# Patient Record
Sex: Female | Born: 1955 | Race: White | Hispanic: No | Smoking: Never smoker
Health system: Southern US, Community
[De-identification: ages and names within clinical notes are randomized; demographics above are authoritative.]

## PROBLEM LIST (undated history)

## (undated) DIAGNOSIS — I10 Essential (primary) hypertension: Secondary | ICD-10-CM

## (undated) DIAGNOSIS — E785 Hyperlipidemia, unspecified: Secondary | ICD-10-CM

## (undated) DIAGNOSIS — E039 Hypothyroidism, unspecified: Secondary | ICD-10-CM

## (undated) DIAGNOSIS — K449 Diaphragmatic hernia without obstruction or gangrene: Secondary | ICD-10-CM

## (undated) DIAGNOSIS — T7840XA Allergy, unspecified, initial encounter: Secondary | ICD-10-CM

## (undated) DIAGNOSIS — K219 Gastro-esophageal reflux disease without esophagitis: Secondary | ICD-10-CM

## (undated) DIAGNOSIS — K222 Esophageal obstruction: Secondary | ICD-10-CM

## (undated) HISTORY — PX: BACK SURGERY: SHX140

## (undated) HISTORY — DX: Gastro-esophageal reflux disease without esophagitis: K21.9

## (undated) HISTORY — DX: Allergy, unspecified, initial encounter: T78.40XA

## (undated) HISTORY — DX: Hypothyroidism, unspecified: E03.9

## (undated) HISTORY — DX: Essential (primary) hypertension: I10

## (undated) HISTORY — DX: Hyperlipidemia, unspecified: E78.5

## (undated) HISTORY — DX: Esophageal obstruction: K22.2

## (undated) HISTORY — DX: Diaphragmatic hernia without obstruction or gangrene: K44.9

---
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
## 1999-03-06 ENCOUNTER — Ambulatory Visit (HOSPITAL_COMMUNITY): Admission: RE | Admit: 1999-03-06 | Discharge: 1999-03-06 | Payer: Self-pay | Admitting: Gastroenterology

## 1999-10-24 ENCOUNTER — Other Ambulatory Visit: Admission: RE | Admit: 1999-10-24 | Discharge: 1999-10-24 | Payer: Self-pay | Admitting: Obstetrics & Gynecology

## 2001-09-26 ENCOUNTER — Other Ambulatory Visit: Admission: RE | Admit: 2001-09-26 | Discharge: 2001-09-26 | Payer: Self-pay | Admitting: Obstetrics and Gynecology

## 2002-12-17 HISTORY — PX: ESOPHAGOGASTRODUODENOSCOPY (EGD) WITH ESOPHAGEAL DILATION: SHX5812

## 2003-01-28 ENCOUNTER — Ambulatory Visit (HOSPITAL_COMMUNITY): Admission: RE | Admit: 2003-01-28 | Discharge: 2003-01-28 | Payer: Self-pay | Admitting: Internal Medicine

## 2003-01-28 ENCOUNTER — Encounter: Payer: Self-pay | Admitting: Internal Medicine

## 2003-10-21 ENCOUNTER — Other Ambulatory Visit: Admission: RE | Admit: 2003-10-21 | Discharge: 2003-10-21 | Payer: Self-pay | Admitting: Obstetrics and Gynecology

## 2004-08-03 ENCOUNTER — Ambulatory Visit (HOSPITAL_COMMUNITY): Admission: RE | Admit: 2004-08-03 | Discharge: 2004-08-03 | Payer: Self-pay | Admitting: Chiropractic Medicine

## 2009-02-11 ENCOUNTER — Ambulatory Visit (HOSPITAL_COMMUNITY): Admission: RE | Admit: 2009-02-11 | Discharge: 2009-02-11 | Payer: Self-pay | Admitting: Internal Medicine

## 2009-03-11 ENCOUNTER — Ambulatory Visit (HOSPITAL_COMMUNITY): Admission: RE | Admit: 2009-03-11 | Discharge: 2009-03-11 | Payer: Self-pay | Admitting: Family Medicine

## 2009-03-31 ENCOUNTER — Ambulatory Visit (HOSPITAL_COMMUNITY): Admission: RE | Admit: 2009-03-31 | Discharge: 2009-04-01 | Payer: Self-pay | Admitting: Neurosurgery

## 2009-03-31 IMAGING — CR DG CHEST 2V
2 series · 2 of 2 positions shown · non-contrast
Comparison: None

CLINICAL DATA: Lumbar HNP and radiculopathy.

CHEST - 2 VIEW

[view not recorded (1 of 2)]
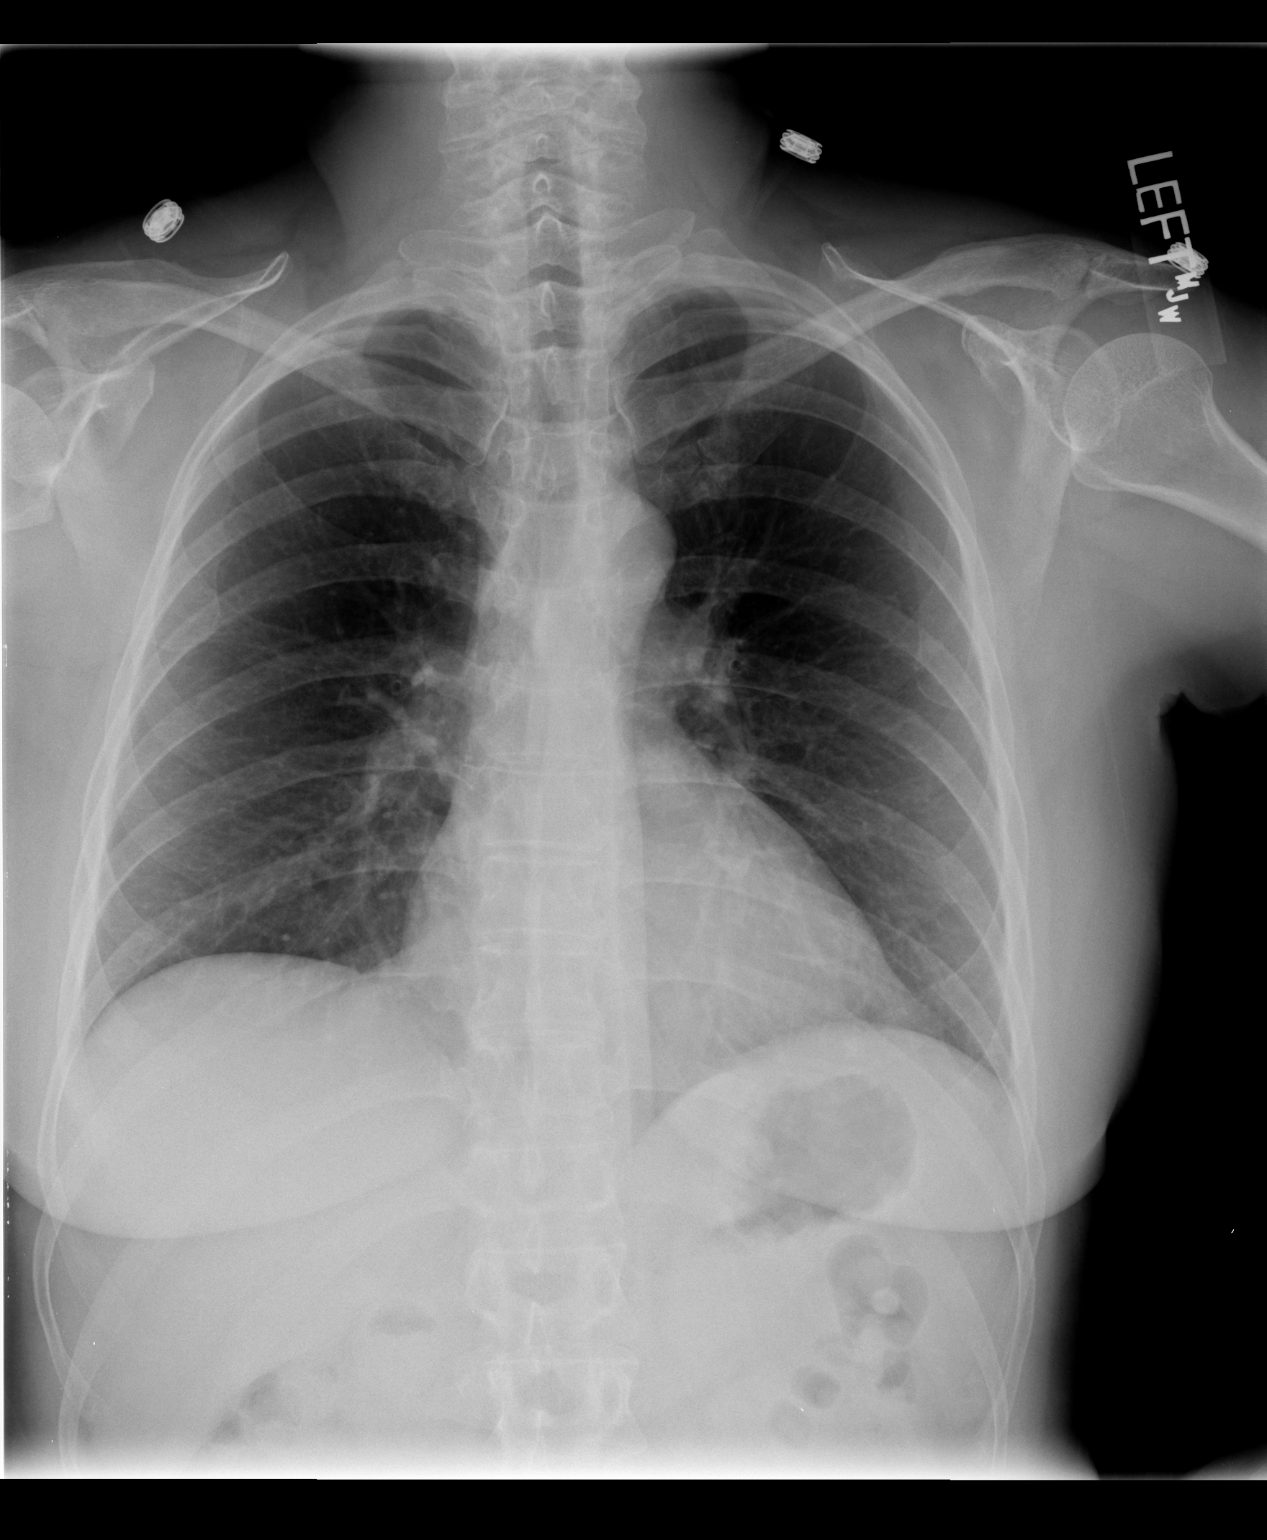

[view not recorded (2 of 2)]
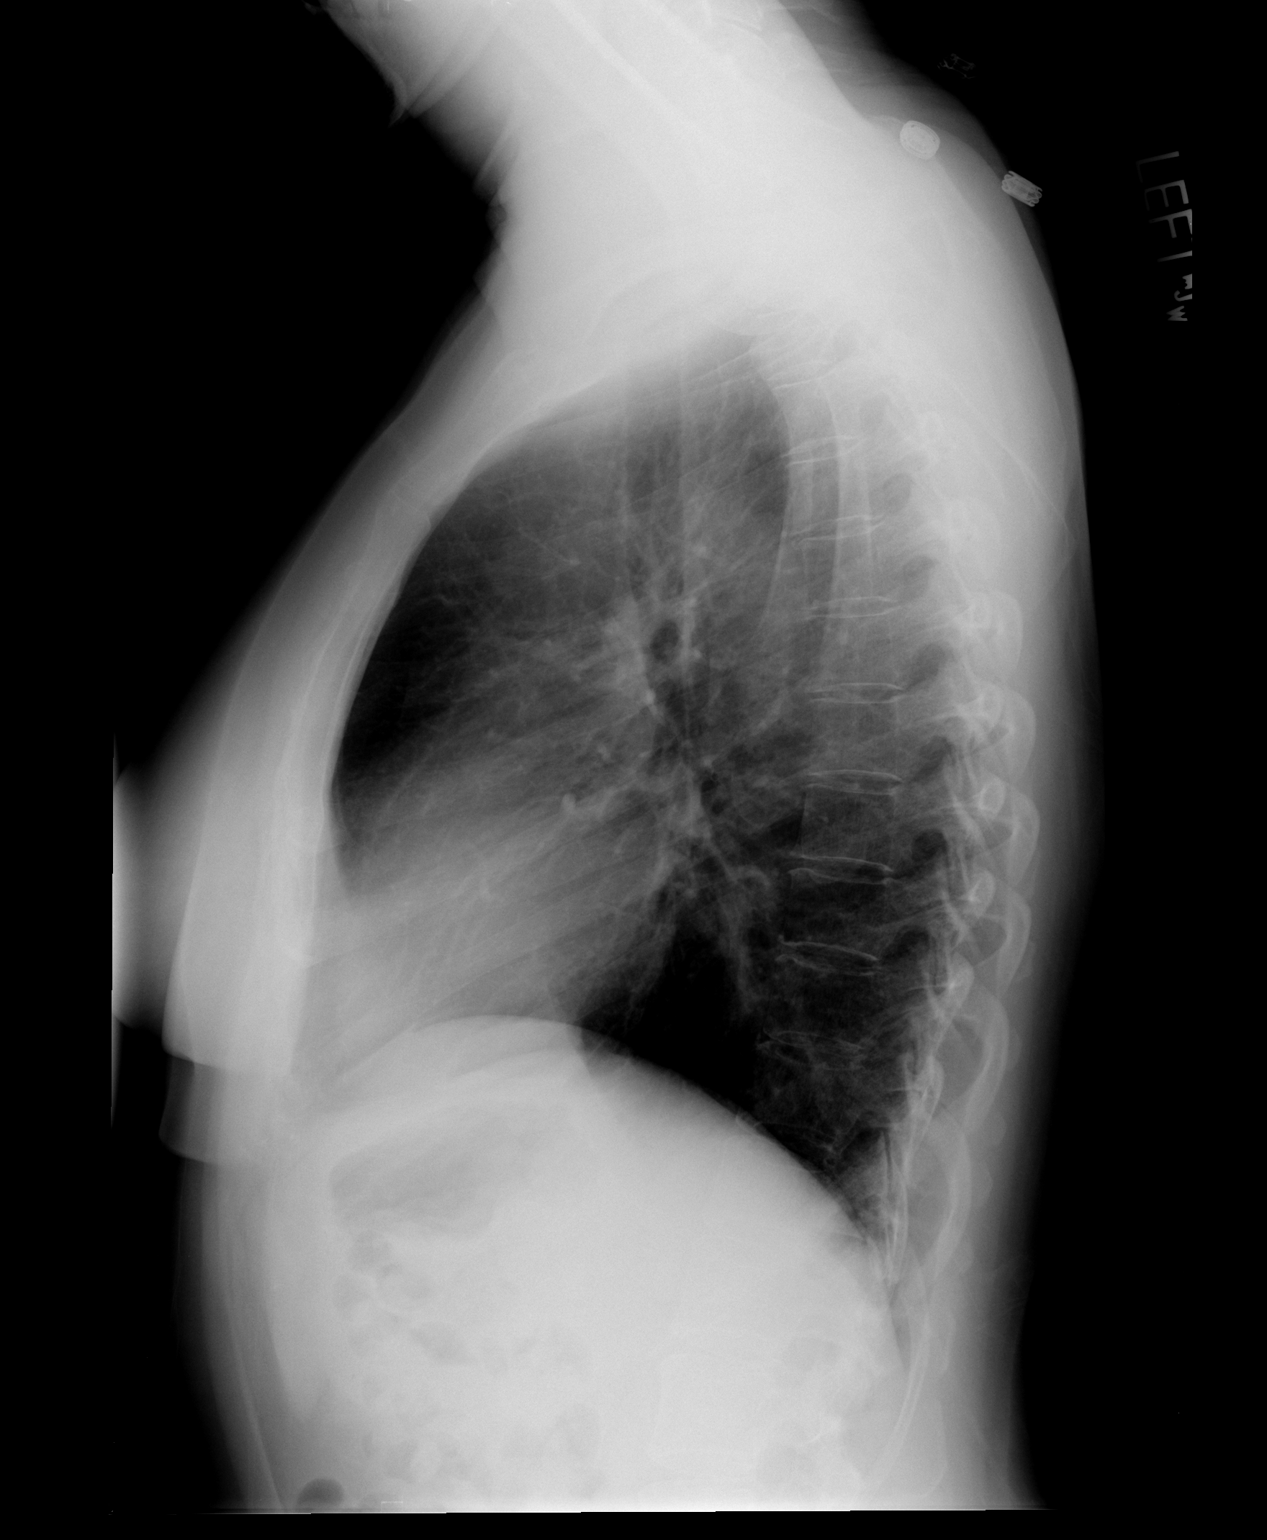

[2 of 2 positions shown; findings below may reference images not displayed]

FINDINGS: The heart size and mediastinal contours are within normal
limits.  Both lungs are clear.  The visualized skeletal structures
are unremarkable.
IMPRESSION: No active cardiopulmonary disease.

## 2011-03-28 LAB — URINALYSIS, ROUTINE W REFLEX MICROSCOPIC
Bilirubin Urine: NEGATIVE
Hgb urine dipstick: NEGATIVE
Ketones, ur: NEGATIVE mg/dL
Nitrite: NEGATIVE
Urobilinogen, UA: 0.2 mg/dL (ref 0.0–1.0)
pH: 8 (ref 5.0–8.0)

## 2011-03-28 LAB — APTT: aPTT: 27 seconds (ref 24–37)

## 2011-03-28 LAB — CBC
Platelets: 278 10*3/uL (ref 150–400)
RDW: 12.6 % (ref 11.5–15.5)

## 2011-03-28 LAB — BASIC METABOLIC PANEL
BUN: 11 mg/dL (ref 6–23)
Calcium: 9.3 mg/dL (ref 8.4–10.5)
Creatinine, Ser: 0.55 mg/dL (ref 0.4–1.2)
GFR calc non Af Amer: >60 mL/min (ref >60)
Glucose, Bld: 118 mg/dL — ABNORMAL HIGH (ref 70–99)

## 2011-03-28 LAB — PROTIME-INR: INR: 0.9 (ref 0.00–1.49)

## 2011-05-01 NOTE — Op Note (Signed)
[name], Sheila [name] NO.:  [id number]   MEDICAL RECORD NO.:  [id number]          PATIENT TYPE:  OIB   LOCATION:  3536                         FACILITY:  [facility]   PHYSICIAN:  Clydene Fake, M.D.  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   PROCEDURE:  Right L4-5 semi hemilaminectomy and diskectomy,  microdissection with microscope.   SURGEON:  Clydene Fake, MD   ASSISTANT:  Coletta Memos, MD   ANESTHESIA:  General endotracheal tube anesthesia.   ESTIMATED BLOOD LOSS:  Minimal.   BLOOD GIVEN:  None.   DRAINS:  None.   COMPLICATIONS:  None.   REASON FOR PROCEDURE:  The patient is a 55 year old woman with right  side back and leg pain with foot drop.  We have been recommending  surgery, but she wanted to try other nonsurgical options, steroid  Dosepaks, time, NSAIDs.  We talked about epidural steroid injections,  had that set up, but she desired not to do that and called [place] to change  her mind about surgery, and we brought her in for diskectomy.   PROCEDURE IN DETAIL:  The patient was brought to the operating room.  General anesthesia was induced.  The patient was placed on a Wilson  frame in a prone position with all pressure points padded.  The patient  was prepped and draped in sterile fashion.  Site of incision injected  with 10 mL of 1% lidocaine with epinephrine.  Needle was placed in the  interspace.  X-ray was obtained showing the needle pointing at the L5-S1  space.  Incision was then made centered the next level higher.  Incision  was taken to the fascia.  Hemostasis was obtained with Bovie  cauterization.  The right side of subperiosteal dissection was done  over  the spinous process lamina out to the facet.  A self-retaining retractor  was placed.  Markers were placed at the L4-5 interspace.  Another x-ray  was obtained confirming our positioning at L4-5.  At this point, a semi-  hemilaminectomy and facetectomy was performed with high-speed drill,  Kerrison punches.  Microscope was brought in for microdissection.  The  laminotomy was continued, removed hypertrophic ligament both posteriorly  and laterally and removed the top of the L5 lamina and did a  foraminotomy of the L5 root.  There was a very thick ligament that was a  part of the problem with the spondylosis, and was removed that had some  decompression of the thecal sac and the nerve root.  We then explored  the epidural space and disk space and found a disk herniation going  inferiorly right under the L5 root.  We were able to remove this as a  free fragment and the subligamentous fragment and this decompressed the  L5 root.  There was a small tear within the annulus.  We  were able to  push on the annulus and had some more disk extruded out and we removed  out, but after we continued pushing on the annulus and disk space, no  more disk was coming out through the small annular tear.  We explored  that, it was fairly small, so we did not open the disk space.  We used  bipolar to shrink up the annular fibers there and get hemostasis.  We  irrigated with antibiotic solution.  We had good hemostasis.  We had  good decompression of the thecal sac and the L5 root and no further disk  herniation was seen or appreciated.  At this point, the retractor was  removed.  Fascia was closed with 0 Vicryl interrupted sutures.  Subcutaneous tissue was closed with 0 Vicryl, 2-0 Vicryl, and 3-0 Vicryl  interrupted suture.  Skin was closed with benzoin and Steri-Strips.  Dressing was placed.  The patient was placed back in supine position,  awaken from anesthesia, and transferred to recovery room in  stable  condition.           ______________________________  Clydene Fake, M.D.     JRH/MEDQ  D:  03/31/2009  T:  04/01/2009  Job:  [id number]

## 2013-01-20 ENCOUNTER — Encounter: Payer: Self-pay | Admitting: Obstetrics and Gynecology

## 2013-01-20 ENCOUNTER — Ambulatory Visit: Payer: 59 | Admitting: Obstetrics and Gynecology

## 2013-01-20 VITALS — BP 134/76 | Ht 62.0 in | Wt 123.0 lb

## 2013-01-20 DIAGNOSIS — Z01419 Encounter for gynecological examination (general) (routine) without abnormal findings: Secondary | ICD-10-CM

## 2013-01-20 DIAGNOSIS — Z124 Encounter for screening for malignant neoplasm of cervix: Secondary | ICD-10-CM

## 2013-01-20 NOTE — Progress Notes (Signed)
Subjective:    Sheila [name] is a 57 y.o. female G1P0001 who presents for annual exam. The patient complaints of cramps in her legs. The patient has a history of hypothyroidism, hyperlipidemia, and urinary incontinence  The following portions of the patient's history were reviewed and updated as appropriate: allergies, current medications, past family history, past medical history, past social history, past surgical history and problem list.  Review of Systems Pertinent items are noted in HPI. Gastrointestinal:No change in bowel habits, no abdominal pain, no rectal bleeding Genitourinary:negative for dysuria, frequency, hematuria    Objective:     BP 134/76  Ht 5\' 2"  (1.575 m)  Wt 123 lb (55.792 kg)  BMI 22.50 kg/m2  LMP 02/13/2005  Weight:  Wt Readings from Last 1 Encounters:  01/20/13 123 lb (55.792 kg)     BMI: Body mass index is 22.50 kg/(m^2). General Appearance: Alert, appropriate appearance for age. No acute distress HEENT: Grossly normal Neck / Thyroid: Supple, no masses, nodes or enlargement Lungs: clear to auscultation bilaterally Back: No CVA tenderness Breast Exam: No masses or nodes.No dimpling, nipple retraction or discharge. Cardiovascular: Regular rate and rhythm. S1, S2, no murmur Gastrointestinal: Soft, non-tender, no masses or organomegaly  ++++++++++++++++++++++++++++++++++++++++++++++++++++++++  Pelvic Exam: External genitalia: normal general appearance Vaginal: atrophic mucosa Cervix: normal appearance Adnexa: normal bimanual exam Uterus: normal size shape and consistency Rectovaginal: normal rectal, no masses  ++++++++++++++++++++++++++++++++++++++++++++++++++++++++  Lymphatic Exam: Non-palpable nodes in neck, clavicular, axillary, or inguinal regions  Psychiatric: Alert and oriented, appropriate affect.      Assessment:    Normal gyn exam   Overweight or obese: No  Pelvic relaxation: Yes  Menopausal symptoms: No. Severe:  No.  Hyperlipidemia  Hypothyroidism  Urinary incontinence  Leg cramps   Plan:    Mammogram. Pap smear.   Follow-up:  for annual exam  See her primary physician for leg cramps and general medical care  The updated Pap smear screening guidelines were discussed with the patient. The patient requested that I obtain a Pap smear: Yes.  Kegel exercises discussed: Yes.  Proper diet and regular exercise were reviewed.  Annual mammograms recommended starting at age 41. Proper breast care was discussed.  Screening colonoscopy is recommended beginning at age 41.  Regular health maintenance was reviewed.  Sleep hygiene was discussed.  Adequate calcium and vitamin D intake was emphasized.  Leonard Schwartz M.D.   Regular Periods: no  Mammogram: Yes  Monthly Breast Ex.: yes Exercise: no  Tetanus < 10 years: no Seatbelts: yes  NI. Bladder Functn.: yes Abuse at home: no  Daily BM's: yes Stressful Work: no  Healthy Diet: no Sigmoid-Colonoscopy: per pt 1998  Calcium: no Medical problems this year: none   LAST PAP:12/29/08  Contraception: none  Mammogram:  09/05/2011  PCP: Dr Antony Haste  PMH: none  FMH: none  Last Bone Scan: 03/14/2009

## 2013-01-22 LAB — PAP IG W/ RFLX HPV ASCU

## 2014-10-18 ENCOUNTER — Encounter: Payer: Self-pay | Admitting: Obstetrics and Gynecology

## 2015-06-09 ENCOUNTER — Other Ambulatory Visit: Payer: Self-pay

## 2017-02-08 ENCOUNTER — Encounter: Payer: Self-pay | Admitting: Family Medicine

## 2017-02-08 ENCOUNTER — Encounter (HOSPITAL_COMMUNITY): Payer: Self-pay | Admitting: *Deleted

## 2017-02-08 ENCOUNTER — Encounter (HOSPITAL_COMMUNITY)
Admission: EM | Disposition: A | Discharge status: Home / Self Care | Payer: Self-pay | Source: Home / Self Care | Attending: Emergency Medicine

## 2017-02-08 ENCOUNTER — Ambulatory Visit (INDEPENDENT_AMBULATORY_CARE_PROVIDER_SITE_OTHER): Payer: 59 | Admitting: Family Medicine

## 2017-02-08 ENCOUNTER — Ambulatory Visit (HOSPITAL_COMMUNITY)
Admission: EM | Admit: 2017-02-08 | Discharge: 2017-02-08 | Disposition: A | Discharge status: Home / Self Care | Payer: 59 | Attending: Emergency Medicine | Admitting: Emergency Medicine

## 2017-02-08 ENCOUNTER — Encounter: Payer: Self-pay | Admitting: Physician Assistant

## 2017-02-08 VITALS — BP 167/92 | HR 69 | Temp 98.1°F | Resp 20 | Ht 62.0 in | Wt 119.8 lb

## 2017-02-08 DIAGNOSIS — T18128A Food in esophagus causing other injury, initial encounter: Secondary | ICD-10-CM | POA: Diagnosis present

## 2017-02-08 DIAGNOSIS — K222 Esophageal obstruction: Secondary | ICD-10-CM

## 2017-02-08 DIAGNOSIS — K219 Gastro-esophageal reflux disease without esophagitis: Secondary | ICD-10-CM | POA: Diagnosis not present

## 2017-02-08 DIAGNOSIS — Z7689 Persons encountering health services in other specified circumstances: Secondary | ICD-10-CM

## 2017-02-08 DIAGNOSIS — E785 Hyperlipidemia, unspecified: Secondary | ICD-10-CM | POA: Diagnosis not present

## 2017-02-08 DIAGNOSIS — Z803 Family history of malignant neoplasm of breast: Secondary | ICD-10-CM | POA: Insufficient documentation

## 2017-02-08 DIAGNOSIS — K2210 Ulcer of esophagus without bleeding: Secondary | ICD-10-CM | POA: Diagnosis not present

## 2017-02-08 DIAGNOSIS — R1310 Dysphagia, unspecified: Secondary | ICD-10-CM | POA: Diagnosis not present

## 2017-02-08 DIAGNOSIS — X58XXXA Exposure to other specified factors, initial encounter: Secondary | ICD-10-CM | POA: Insufficient documentation

## 2017-02-08 DIAGNOSIS — Z79899 Other long term (current) drug therapy: Secondary | ICD-10-CM | POA: Insufficient documentation

## 2017-02-08 DIAGNOSIS — Z886 Allergy status to analgesic agent status: Secondary | ICD-10-CM | POA: Insufficient documentation

## 2017-02-08 DIAGNOSIS — Z833 Family history of diabetes mellitus: Secondary | ICD-10-CM | POA: Diagnosis not present

## 2017-02-08 DIAGNOSIS — E079 Disorder of thyroid, unspecified: Secondary | ICD-10-CM | POA: Diagnosis not present

## 2017-02-08 DIAGNOSIS — K449 Diaphragmatic hernia without obstruction or gangrene: Secondary | ICD-10-CM | POA: Insufficient documentation

## 2017-02-08 DIAGNOSIS — Z8349 Family history of other endocrine, nutritional and metabolic diseases: Secondary | ICD-10-CM | POA: Insufficient documentation

## 2017-02-08 DIAGNOSIS — Z800 Family history of malignant neoplasm of digestive organs: Secondary | ICD-10-CM | POA: Diagnosis not present

## 2017-02-08 HISTORY — PX: ESOPHAGOGASTRODUODENOSCOPY: SHX5428

## 2017-02-08 SURGERY — EGD (ESOPHAGOGASTRODUODENOSCOPY)
Anesthesia: Moderate Sedation

## 2017-02-08 MED ORDER — DIPHENHYDRAMINE HCL 50 MG/ML IJ SOLN
INTRAMUSCULAR | Status: DC | PRN
  Administered 2017-02-08: 25 mg via INTRAVENOUS

## 2017-02-08 MED ORDER — FENTANYL CITRATE (PF) 100 MCG/2ML IJ SOLN
INTRAMUSCULAR | Status: AC
  Filled 2017-02-08: qty 2

## 2017-02-08 MED ORDER — MIDAZOLAM HCL 5 MG/ML IJ SOLN
INTRAMUSCULAR | Status: AC
  Filled 2017-02-08: qty 2

## 2017-02-08 MED ORDER — MIDAZOLAM HCL 10 MG/2ML IJ SOLN
INTRAMUSCULAR | Status: DC | PRN
  Administered 2017-02-08: 2 mg via INTRAVENOUS
  Administered 2017-02-08: 1 mg via INTRAVENOUS

## 2017-02-08 MED ORDER — DIPHENHYDRAMINE HCL 50 MG/ML IJ SOLN
INTRAMUSCULAR | Status: AC
  Filled 2017-02-08: qty 1

## 2017-02-08 MED ORDER — FENTANYL CITRATE (PF) 100 MCG/2ML IJ SOLN
INTRAMUSCULAR | Status: DC | PRN
  Administered 2017-02-08: 25 ug via INTRAVENOUS

## 2017-02-08 NOTE — ED Notes (Signed)
Bed: RESA Expected date:  Expected time:  Means of arrival:  Comments: Hold for endo

## 2017-02-08 NOTE — ED Notes (Signed)
Endo report  Pt had 3 of versed  25 fentanyl  25 benadryl  ----------- monitor patient until awake and 30 to 45 mins after. Per verbal by provider  ------------ Pt to be on a all liquid diet for next 24hrs  ------------ Pt needs to be on Carafate 10cc every 6 hrs for next few days and then PRN as Needed.  -------

## 2017-02-08 NOTE — ED Triage Notes (Addendum)
Pt sent from [place] center for food bolus and vomiting/unable to keep anything down. Dr. Ruthell Rummage is the accepting doctor.   Pt received 1000cc NS, ativan, zofran, glucagon, GI cocktail with no relief.

## 2017-02-08 NOTE — ED Triage Notes (Signed)
Pt is alert x4 , pt consent form in room, pt fluids attached, pt placed on cardiac monitor and pt has IV access. Pt has gastro nurse in room.

## 2017-02-08 NOTE — H&P (View-Only) (Signed)
   HPI :  61 y/o female with a history of esophageal stricture s/p dilation in 2004, GERD, hiatal hernia, presenting with a food impaction to the ER this evening. She reports eating chicken and green beans last night, felt something get stuck and tried to vomit it up without success. She has had some mild low sternal discomfort since this time but has not been able to tolerate any food or liquids for the past 24 hours. She endorses periodic dysphagia but not severe. She's been on protonix which has controlled her reflux symptoms. She was seen in [place] ER today, they offered her transfer to [facility] but she declined, preferring to come here as she is known to Dr. Henrene Pastor. Trial of glucagon did not provide any benefit. She has never had a food impaction in the past. No chest pains or shortness of breath otherwise. No problems with anesthesia in the past.   Past Medical History:  Diagnosis Date  . Allergy   . Esophageal stricture   . GERD (gastroesophageal reflux disease)   . Hiatal hernia   . Hyperlipidemia   . Thyroid disease      Past Surgical History:  Procedure Laterality Date  . APPENDECTOMY    . BACK SURGERY    . ESOPHAGOGASTRODUODENOSCOPY (EGD) WITH ESOPHAGEAL DILATION  2004   Family History  Problem Relation Age of Onset  . Diabetes Mother   . Breast cancer Mother   . Hyperlipidemia Mother   . Cancer Father     colon/brain  . Early death Father    Social History  Substance Use Topics  . Smoking status: Never Smoker  . Smokeless tobacco: Never Used  . Alcohol use No   No current facility-administered medications for this encounter.    Current Outpatient Prescriptions  Medication Sig Dispense Refill  . levothyroxine (SYNTHROID, LEVOTHROID) 75 MCG tablet TAKE ONE TABLET BY MOUTH EVERY DAY    . pantoprazole (PROTONIX) 40 MG tablet Take 40 mg by mouth daily.     Allergies  Allergen Reactions  . Nsaids      Review of Systems: All systems reviewed  and negative except where noted in HPI.     Physical Exam: vitals per ED charting Constitutional: Pleasant,well-developed,female in no acute distress. HEENT: Normocephalic and atraumatic. Conjunctivae are normal. No scleral icterus. Neck supple.  Cardiovascular: Normal rate, regular rhythm.  Pulmonary/chest: Effort normal and breath sounds normal.  Abdominal: Soft, nondistended, nontender. There are no masses palpable. No hepatomegaly. Extremities: no edema Lymphadenopathy: No cervical adenopathy noted. Neurological: Alert and oriented to person place and time. Skin: Skin is warm and dry. No rashes noted. Psychiatric: Normal mood and affect. Behavior is normal.   ASSESSMENT AND PLAN: 61 y/o female with history of distal esophageal stricture, presenting with a food impaction which occurred > 48 hours. Intolerant of liquids. I discussed the need for emergent endoscopy to relieve food bolus. I discussed the risks and benefits of endoscopy and sedation with her and she wished to proceed. Depending on findings, assuming this impaction is caused by known peptic stricture, may dilate if amenable on exam. She agreed and verbalized understanding.   [place] Cellar, MD [facility] Gastroenterology Pager [phone]

## 2017-02-08 NOTE — Progress Notes (Signed)
Patient ID: Sheila [name], female  DOB: 15-Dec-1956, 61 y.o.   MRN: [id number] Patient Care Team    Relationship Specialty Notifications Start End  Ma Hillock, DO PCP - General Family Medicine  02/08/17   Ena Dawley, MD Consulting Physician Obstetrics and Gynecology  02/11/17   Irene Shipper, MD Consulting Physician Gastroenterology  02/11/17     Subjective:  Sheila [name] is a 61 y.o.  female present for new patient establishment. All past medical history, surgical history, allergies, family history, immunizations, medications and social history were Obtained/entered in the electronic medical record today. All recent labs, ED visits and hospitalizations within the last year were reviewed.  Dysphagia: Patient presents for new patient establishment today with reports of difficulty swallowing over the last couple days. She states she's had an esophageal dilation completed in 2004 by Dr. Henrene Pastor. She has a history of reflux and is prescribed protonic 40 mg daily. She reports she is able to swallow, but food and liquid is getting caught. She feels as if something is caught in her throat currently.   There is no immunization history on file for this patient.   Past Medical History:  Diagnosis Date  . Allergy   . Esophageal stricture   . GERD (gastroesophageal reflux disease)   . Hiatal hernia   . Hyperlipidemia   . Hypothyroidism    Allergies  Allergen Reactions  . Nsaids    Past Surgical History:  Procedure Laterality Date  . APPENDECTOMY  1967  . BACK SURGERY    . ESOPHAGOGASTRODUODENOSCOPY N/A 02/08/2017   Procedure: ESOPHAGOGASTRODUODENOSCOPY (EGD);  Surgeon: Manus Gunning, MD;  Location: [facility];  Service: Gastroenterology;  Laterality: N/A;  . ESOPHAGOGASTRODUODENOSCOPY (EGD) WITH ESOPHAGEAL DILATION  2004   Family History  Problem Relation Age of Onset  . Diabetes Mother   . Breast cancer Mother   . Hyperlipidemia Mother   . Cancer Father       colon/brain  . Early death Father    Social History   Social History  . Marital status: Divorced    Spouse name: N/A  . Number of children: N/A  . Years of education: 76   Occupational History  . [employer]    Social History Main Topics  . Smoking status: Never Smoker  . Smokeless tobacco: Never Used  . Alcohol use No  . Drug use: No  . Sexual activity: Yes    Partners: Male    Birth control/ protection: None   Other Topics Concern  . Not on file   Social History Narrative   Single. 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   Drinks caffeine.   Wears a seatbelt, smoke detector in the home.   Feels safe in her relationships.   Allergies as of 02/08/2017      Reactions   Nsaids       Medication List       Accurate as of 02/08/17  8:40 PM. Always use your most recent med list.          levothyroxine 75 MCG tablet Commonly known as:  SYNTHROID, LEVOTHROID TAKE 75 MCG BY MOUTH EVERY MORNING   pantoprazole 40 MG tablet Commonly known as:  PROTONIX Take 40 mg by mouth daily.        No results found for this or any previous visit (from the past 2160 hour(s)).  Dg Chest 2 View  Result Date: 03/31/2009 Clinical Data: Lumbar HNP and radiculopathy.  CHEST -  2 VIEW  Comparison: None  Findings: The heart size and mediastinal contours are within normal limits.  Both lungs are clear.  The visualized skeletal structures are unremarkable.  IMPRESSION: No active cardiopulmonary disease. Provider: Anne Ng  Dg Lumbar Spine 2-3 Views  Result Date: 03/31/2009 Clinical Data: L4-L5 discectomy.  LUMBAR SPINE - 2-3 VIEW  Comparison: None  Findings: 2 intraoperative images.  The first film, labeled 1635 hours, demonstrates a surgical device projecting posterior to the L5-S1 level.  This presumes a diminutive S1-S2 intervertebral disc.  The second film, labeled 1647 hours, demonstrates a surgical device projecting posterior to the L4-L5 level.  IMPRESSION:  1.   Intraoperative localization of L4-L5. 2.  Please note that this nomenclature presumes a diminutive S1-S2 disc.  If there is prior imaging for comparison, this should be correlated. Provider: Hampton Abbot, Starleen Arms    ROS: 14 pt review of systems performed and negative (unless mentioned in an HPI)  Objective: BP (!) 167/92 (BP Location: Left Arm, Patient Position: Sitting, Cuff Size: Normal)   Pulse 69   Temp 98.1 F (36.7 C)   Resp 20   Ht 5\' 2"  (1.575 m)   Wt 119 lb 12 oz (54.3 kg)   LMP 02/13/2005   SpO2 99%   BMI 21.90 kg/m  Gen: Afebrile. No acute distress. Nontoxic in appearance, well-developed, well-nourished, pleasant Caucasian female. HENT: AT. .  Mildly tacky mucous membranes, no oral lesions. Throat without erythema, ulcerations or exudates. No Cough on exam, no hoarseness on exam. Able to swallow without difficulty. Eyes:Pupils Equal Round Reactive to light, Extraocular movements intact,  Conjunctiva without redness, discharge or icterus. Neck/lymp/endocrine: Supple, no lymphadenopathy, no thyromegaly CV: RRR, no edema, +2/4 P posterior tibialis pulses.  Chest: CTAB, no wheeze, rhonchi or crackles.  Abd: Soft.NTND. BS present  Skin:  Warm and well-perfused. Skin intact. Neuro/Msk:Normal gait. PERLA. EOMi. Alert. Oriented x3.   Psych: Mildly anxious, otherwise Normal affect, dress and demeanor. Normal speech. Normal thought content and judgment.   Assessment/plan: Sheila [name] is a 61 y.o. female present for establishment of care with dysphagia. Dysphagia, unspecified type/Gastroesophageal reflux disease, esophagitis presence not specified - Discussed with patient that her symptoms sound as if she has if she either has food actively caught in her esophagus, or she has enough swelling/stenosis of her esophagus that the sensation of something being stuck is present. Either way this is cause for a urgent referral versus emergency room visit to address.  Discussed with patient if she is absolutely unable to eat or drink anything, she is to be seen in the emergency room over the weekend. She currently looks mildly dehydrated already. We have contacted Dr. Blanch Media office to schedule her urgent appointment, the earliest they could get her in was early March.  As far as establishment, patient will need follow-up for physical after March 27, and labs will be collected at that time.   No Follow-up on file.  Electronically signed by: Howard Pouch, DO [place]

## 2017-02-08 NOTE — Interval H&P Note (Signed)
History and Physical Interval Note:  02/08/2017 9:08 PM  Sheila [name]  has presented today for surgery, with the diagnosis of food impaction  The various methods of treatment have been discussed with the patient and family. After consideration of risks, benefits and other options for treatment, the patient has consented to  Procedure(s): ESOPHAGOGASTRODUODENOSCOPY (EGD) (N/A) as a surgical intervention .  The patient's history has been reviewed, patient examined, no change in status, stable for surgery.  I have reviewed the patient's chart and labs.  Questions were answered to the patient's satisfaction.     [name]

## 2017-02-08 NOTE — Discharge Instructions (Signed)
Pt to be on a all liquid diet for next 24hrs  ------------ Pt needs to be on Carafate 10cc every 6 hrs for next few days and then PRN as Needed.

## 2017-02-08 NOTE — Op Note (Addendum)
[facility] Patient Name: Ishi Ledden Procedure Date: 02/08/2017 MRN: [id number] Attending MD: Carlota Raspberry. Armbruster MD, MD Date of Birth: Dec 08, 1956 CSN: [id number] Age: 61 Admit Type: Emergency Department Procedure:                Upper GI endoscopy Indications:              Foreign body in the esophagus, history of dysphagia                            and esophageal stricture Providers:                Remo Lipps P. Armbruster MD, MD, Vista Lawman, RN,                            Corliss Parish, Technician Referring MD:              Medicines:                Fentanyl 25 micrograms IV, Midazolam 3 mg IV,                            Diphenhydramine 25 mg IV Complications:            No immediate complications. Estimated blood loss:                            None. Estimated Blood Loss:     Estimated blood loss: none. Procedure:                Pre-Anesthesia Assessment:                           - Prior to the procedure, a History and Physical                            was performed, and patient medications and                            allergies were reviewed. The patient's tolerance of                            previous anesthesia was also reviewed. The risks                            and benefits of the procedure and the sedation                            options and risks were discussed with the patient.                            All questions were answered, and informed consent                            was obtained. Prior Anticoagulants: The patient has  taken no previous anticoagulant or antiplatelet                            agents. ASA Grade Assessment: II - A patient with                            mild systemic disease. After reviewing the risks                            and benefits, the patient was deemed in                            satisfactory condition to undergo the procedure.                           After obtaining  informed consent, the endoscope was                            passed under direct vision. Throughout the                            procedure, the patient's blood pressure, pulse, and                            oxygen saturations were monitored continuously. The                            [id number]) scope was introduced through the                            mouth, and advanced to the body of the stomach. The                            upper GI endoscopy was accomplished without                            difficulty. The patient tolerated the procedure                            well. Scope In: Scope Out: Findings:      Food bolus (chicken) was found at the gastroesophageal junction and       pushed into the stomach with mild resistance.      Esophagogastric landmarks were identified: the Z-line was found at 31       cm, the gastroesophageal junction was found at 31 cm and the upper       extent of the gastric folds was found at 38 cm from the incisors.      A 6 cm hiatal hernia was present.      One moderate benign-appearing, intrinsic stenosis was found. This       measured less than one cm and perhaps 12-67mm in diameter and was       traversed. There was an ulceration along the superior aspect of the       stricture at the site of impaction, roughly  30% of the diameter of the       stricture without any bleeding. No dilation was performed given this       finding.      The exam of the esophagus was otherwise normal.      Limited examination of the gastric body was normal. The patient did not       retain air well in her stomach and retroflexion was not performed given       residual food noted. Not much of the stomach was evaluated. Impression:               - Food was found in the esophagus, easily pushed                            into the stomach.                           - Esophagogastric landmarks identified.                           - 6 cm hiatal hernia.                            - Benign-appearing esophageal stenosis with                            ulceration at site of impaction. No bleeding                            appreciated.                           - Normal limited exam of proximal stomach. Moderate Sedation:      Moderate (conscious) sedation was administered by the endoscopy nurse       and supervised by the endoscopist. The following parameters were       monitored: oxygen saturation, heart rate, blood pressure, and response       to care. Total physician intraservice time was 9 minutes. Recommendation:           - Patient has a contact number available for                            emergencies. The signs and symptoms of potential                            delayed complications were discussed with the                            patient. Return to normal activities tomorrow.                            Written discharge instructions were provided to the                            patient.                           -  Clear liquid diet today, advance to soft tomorrow                            as tolerated. NO MEAT until repeat EGD is done                           - Continue present medications (protonix daily)                           - Add liquid Carafate 10cc PO every 6 hours for the                            next few days to help heal esophageal ulceration                           - Repeat upper endoscopy in 2-3 weeks as outpatient                            for dilation to prevent recurrent impaction Procedure Code(s):        --- Professional ---                           [id number], 75, Esophagogastroduodenoscopy, flexible,                            transoral; diagnostic, including collection of                            specimen(s) by brushing or washing, when performed                            (separate procedure) Diagnosis Code(s):        --- Professional ---                           [id number], Food in esophagus causing other  injury,                            initial encounter                           K44.9, Diaphragmatic hernia without obstruction or                            gangrene                           K22.2, Esophageal obstruction                           T18.108A, Unspecified foreign body in esophagus                            causing other injury, initial encounter CPT copyright 2016 American Medical Association. All rights reserved. The codes documented in this report are preliminary and upon  coder review may  be revised to meet current compliance requirements. Remo Lipps P. Armbruster MD, MD 02/08/2017 9:49:19 PM This report has been signed electronically. Number of Addenda: 0

## 2017-02-08 NOTE — Patient Instructions (Signed)
We have your appt scheduled for the date on your card with Dr. Henrene Pastor.   If you are unable to eat or drink without choking you will need to go to ED immediately.   Follow up after March 28 th for physical.   It was nice to meet you today.     Achalasia Introduction Achalasia is a disorder of the tube that connects your mouth and stomach (esophagus). This disorder makes it difficult for your esophagus to move liquids and food into your stomach. This makes it harder for you to swallow. Achalasia also affects the muscle between your stomach and your esophagus (lower esophageal sphincter, LES). Normally, this muscle relaxes after you swallow. With achalasia, it does not relax, and there is increased pressure in this area. What are the causes? The cause is not known. What increases the risk? Risk factors may include:  Age. Achalasia is more common in older adults.  Family history of achalasia. What are the signs or symptoms? Signs and symptoms of achalasia may include:  Difficulty swallowing or painful swallowing.  Weight loss.  Chest pain.  Coughing or wheezing.  Heartburn.  Burping.  Vomiting.  Bad breath (halitosis). How is this diagnosed? Achalasia may be diagnosed by:  X-ray.  Endoscopy. This is when your health care provider looks at your esophagus with a small flexible tube that has a video camera at the end.  Studies to see if there is increased pressure in your LES. How is this treated? There is no cure for achalasia. Treatment is directed at managing your symptoms. Treatment may include:  Medicines.  Stretching or dilating your LES.  Surgery to reduce pressure in your LES. Follow these instructions at home:  Take medicines only as directed by your health care provider.  Do not eat or drink while lying down.  Do not drink hot or cold liquids.  Eat your food slowly.  Keep all follow-up visits as directed by your health care provider. This is  important. Contact a health care provider if:  Your symptoms don't go away after treatment.  You have a fever.  You have any new symptoms.  Your pain is worse. Get help right away if:  You vomit blood.  Your have chest pain.  You have difficulty breathing. This information is not intended to replace advice given to you by your health care provider. Make sure you discuss any questions you have with your health care provider. Document Released: 09/12/2005 Document Revised: 05/10/2016 Document Reviewed: 05/11/2014  2017 Elsevier  Please help [place] help you:  We are honored you have chosen [facility] for your Primary Care home. Below you will find basic instructions that you may need to access in the future. Please help [place] help you by reading the instructions, which cover many of the frequent questions we experience.   Prescription refills and request:  -In order to allow more efficient response time, please call your pharmacy for all refills. They will forward the request electronically to [place]. This allows for the quickest possible response. Request left on a nurse line can take longer to refill, since these are checked as time allows between office patients and other phone calls.  - refill request can take up to 3-5 working days to complete.  - If request is sent electronically and request is appropiate, it is usually completed in 1-2 business days.  - all patients will need to be seen routinely for all chronic medical conditions requiring prescription medications (see follow-up  below). If you are overdue for follow up on your condition, you will be asked to make an appointment and we will call in enough medication to cover you until your appointment (up to 30 days).  - all controlled substances will require a face to face visit to request/refill.  - if you desire your prescriptions to go through a new pharmacy, and have an active script at original pharmacy, you will need to call  your pharmacy and have scripts transferred to new pharmacy. This is completed between the pharmacy locations and not by your provider.    Results: If any images or labs were ordered, it can take up to 1 week to get results depending on the test ordered and the lab/facility running and resulting the test. - Normal or stable results, which do not need further discussion, will be released to your mychart immediately with attached note to you. A call will not be generated for normal results. Please make certain to sign up for mychart. If you have questions on how to activate your mychart you can call the front office.  - If your results need further discussion, our office will attempt to contact you via phone, and if unable to reach you after 2 attempts, we will release your abnormal result to your mychart with instructions.  - All results will be automatically released in mychart after 1 week.  - Your provider will provide you with explanation and instruction on all relevant material in your results. Please keep in mind, results and labs may appear confusing or abnormal to the untrained eye, but it does not mean they are actually abnormal for you personally. If you have any questions about your results that are not covered, or you desire more detailed explanation than what was provided, you should make an appointment with your provider to do so.   Our office handles many outgoing and incoming calls daily. If we have not contacted you within 1 week about your results, please check your mychart to see if there is a message first and if not, then contact our office.  In helping with this matter, you help decrease call volume, and therefore allow [place] to be able to respond to patients needs more efficiently.   Acute office visits (sick visit):  An acute visit is intended for a new problem and are scheduled in shorter time slots to allow schedule openings for patients with new problems. This is the appropriate  visit to discuss a new problem. In order to provide you with excellent quality medical care with proper time for you to explain your problem, have an exam and receive treatment with instructions, these appointments should be limited to one new problem per visit. If you experience a new problem, in which you desire to be addressed, please make an acute office visit, we save openings on the schedule to accommodate you. Please do not save your new problem for any other type of visit, let us take care of it properly and quickly for you.   Follow up visits:  Depending on your condition(s) your provider will need to see you routinely in order to provide you with quality care and prescribe medication(s). Most chronic conditions (Example: hypertension, Diabetes, depression/anxiety... etc), require visits a couple times a year. Your provider will instruct you on proper follow up for your personal medical conditions and history. Please make certain to make follow up appointments for your condition as instructed. Failing to do so could result in lapse in  your medication treatment/refills. If you request a refill, and are overdue to be seen on a condition, we will always provide you with a 30 day script (once) to allow you time to schedule.    Medicare wellness (well visit): - we have a wonderful Nurse Maudie Mercury), that will meet with you and provide you will yearly medicare wellness visits. These visits should occur yearly (can not be scheduled less than 1 calendar year apart) and cover preventive health, immunizations, advance directives and screenings you are entitled to yearly through your medicare benefits. Do not miss out on your entitled benefits, this is when medicare will pay for these benefits to be ordered for you.  These are strongly encouraged by your provider and is the appropriate type of visit to make certain you are up to date with all preventive health benefits. If you have not had your medicare wellness exam  in the last 12 months, please make certain to schedule one by calling the office and schedule your medicare wellness with Maudie Mercury as soon as possible.   Yearly physical (well visit):  - Adults are recommended to be seen yearly for physicals. Check with your insurance and date of your last physical, most insurances require one calendar year between physicals. Physicals include all preventive health topics, screenings, medical exam and labs that are appropriate for gender/age and history. You may have fasting labs needed at this visit. This is a well visit (not a sick visit), acute topics should not be covered during this visit.  - Pediatric patients are seen more frequently when they are younger. Your provider will advise you on well child visit timing that is appropriate for your their age. - This is not a medicare wellness visit. Medicare wellness exams do not have an exam portion to the visit. Some medicare companies allow for a physical, some do not allow a yearly physical. If your medicare allows a yearly physical you can schedule the medicare wellness with our nurse Maudie Mercury and have your physical with your provider after, on the same day. Please check with insurance for your full benefits.   Late Policy/No Shows:  - all new patients should arrive 15-30 minutes earlier than appointment to allow [place] time  to  obtain all personal demographics,  insurance information and for you to complete office paperwork. - All established patients should arrive 10-15 minutes earlier than appointment time to update all information and be checked in .  - In our best efforts to run on time, if you are late for your appointment you will be asked to either reschedule or if able, we will work you back into the schedule. There will be a wait time to work you back in the schedule,  depending on availability.  - If you are unable to make it to your appointment as scheduled, please call 24 hours ahead of time to allow [place] to fill the  time slot with someone else who needs to be seen. If you do not cancel your appointment ahead of time, you may be charged a no show fee.

## 2017-02-08 NOTE — Consult Note (Signed)
   HPI :  61 y/o female with a history of esophageal stricture s/p dilation in 2004, GERD, hiatal hernia, presenting with a food impaction to the ER this evening. She reports eating chicken and green beans last night, felt something get stuck and tried to vomit it up without success. She has had some mild low sternal discomfort since this time but has not been able to tolerate any food or liquids for the past 24 hours. She endorses periodic dysphagia but not severe. She's been on protonix which has controlled her reflux symptoms. She was seen in [place] ER today, they offered her transfer to [facility] but she declined, preferring to come here as she is known to Dr. Henrene Pastor. Trial of glucagon did not provide any benefit. She has never had a food impaction in the past. No chest pains or shortness of breath otherwise. No problems with anesthesia in the past.   Past Medical History:  Diagnosis Date  . Allergy   . Esophageal stricture   . GERD (gastroesophageal reflux disease)   . Hiatal hernia   . Hyperlipidemia   . Thyroid disease      Past Surgical History:  Procedure Laterality Date  . APPENDECTOMY    . BACK SURGERY    . ESOPHAGOGASTRODUODENOSCOPY (EGD) WITH ESOPHAGEAL DILATION  2004   Family History  Problem Relation Age of Onset  . Diabetes Mother   . Breast cancer Mother   . Hyperlipidemia Mother   . Cancer Father     colon/brain  . Early death Father    Social History  Substance Use Topics  . Smoking status: Never Smoker  . Smokeless tobacco: Never Used  . Alcohol use No   No current facility-administered medications for this encounter.    Current Outpatient Prescriptions  Medication Sig Dispense Refill  . levothyroxine (SYNTHROID, LEVOTHROID) 75 MCG tablet TAKE ONE TABLET BY MOUTH EVERY DAY    . pantoprazole (PROTONIX) 40 MG tablet Take 40 mg by mouth daily.     Allergies  Allergen Reactions  . Nsaids      Review of Systems: All systems reviewed  and negative except where noted in HPI.     Physical Exam: vitals per ED charting Constitutional: Pleasant,well-developed,female in no acute distress. HEENT: Normocephalic and atraumatic. Conjunctivae are normal. No scleral icterus. Neck supple.  Cardiovascular: Normal rate, regular rhythm.  Pulmonary/chest: Effort normal and breath sounds normal.  Abdominal: Soft, nondistended, nontender. There are no masses palpable. No hepatomegaly. Extremities: no edema Lymphadenopathy: No cervical adenopathy noted. Neurological: Alert and oriented to person place and time. Skin: Skin is warm and dry. No rashes noted. Psychiatric: Normal mood and affect. Behavior is normal.   ASSESSMENT AND PLAN: 61 y/o female with history of distal esophageal stricture, presenting with a food impaction which occurred > 48 hours. Intolerant of liquids. I discussed the need for emergent endoscopy to relieve food bolus. I discussed the risks and benefits of endoscopy and sedation with her and she wished to proceed. Depending on findings, assuming this impaction is caused by known peptic stricture, may dilate if amenable on exam. She agreed and verbalized understanding.   Wellington Cellar, MD [facility] Pager [phone]

## 2017-02-08 NOTE — ED Notes (Signed)
Post procedure v/s done  Pt alert but groggy

## 2017-02-09 NOTE — ED Provider Notes (Signed)
61 yo F with a chief complaint of inability to swallow. Patient was sent from [facility] with likely food impaction. This was resolved with endoscopically. Patient was awake alert able to tolerate by mouth prior to discharge.   Deno Etienne, DO 02/09/17 [phone]

## 2017-02-11 ENCOUNTER — Encounter: Payer: Self-pay | Admitting: Family Medicine

## 2017-02-11 DIAGNOSIS — R1310 Dysphagia, unspecified: Secondary | ICD-10-CM | POA: Insufficient documentation

## 2017-02-11 DIAGNOSIS — E039 Hypothyroidism, unspecified: Secondary | ICD-10-CM | POA: Insufficient documentation

## 2017-02-11 DIAGNOSIS — E785 Hyperlipidemia, unspecified: Secondary | ICD-10-CM | POA: Insufficient documentation

## 2017-02-11 DIAGNOSIS — K219 Gastro-esophageal reflux disease without esophagitis: Secondary | ICD-10-CM | POA: Insufficient documentation

## 2017-02-14 ENCOUNTER — Encounter (HOSPITAL_COMMUNITY): Payer: Self-pay | Admitting: Gastroenterology

## 2017-02-18 ENCOUNTER — Ambulatory Visit (INDEPENDENT_AMBULATORY_CARE_PROVIDER_SITE_OTHER): Payer: 59 | Admitting: Physician Assistant

## 2017-02-18 ENCOUNTER — Encounter: Payer: Self-pay | Admitting: Physician Assistant

## 2017-02-18 VITALS — BP 136/80 | HR 72 | Ht 62.0 in | Wt 121.0 lb

## 2017-02-18 DIAGNOSIS — K222 Esophageal obstruction: Secondary | ICD-10-CM | POA: Diagnosis not present

## 2017-02-18 DIAGNOSIS — Z1211 Encounter for screening for malignant neoplasm of colon: Secondary | ICD-10-CM

## 2017-02-18 DIAGNOSIS — Z1212 Encounter for screening for malignant neoplasm of rectum: Secondary | ICD-10-CM | POA: Diagnosis not present

## 2017-02-18 DIAGNOSIS — R1310 Dysphagia, unspecified: Secondary | ICD-10-CM | POA: Diagnosis not present

## 2017-02-18 MED ORDER — NA SULFATE-K SULFATE-MG SULF 17.5-3.13-1.6 GM/177ML PO SOLN: 1.0000 | ORAL | 0 refills | Status: DC

## 2017-02-18 NOTE — Patient Instructions (Signed)

## 2017-02-18 NOTE — Progress Notes (Signed)
Reviewed.    Agree.

## 2017-02-18 NOTE — Progress Notes (Signed)
Chief Complaint: Dysphagia, colon cancer screening  HPI:    Sheila [name] is a 61 year old Caucasian female with a past medical history of esophageal stricture, GERD, hyperlipidemia and others listed below, who was recently seen in the ER on 02/08/17 for food impaction and follows in clinic today for her complaint of dysphagia and need for colon cancer screening.   Patient typically follows with Dr. Henrene Pastor, but was seen by Dr. Havery Moros in the emergency department for an emergent EGD. EGD performed 02/08/17 showed a finding of food in the esophagus, easily pushed to the stomach, 6 cm hiatal hernia, benign-appearing esophageal stenosis with ulceration at site of impaction with no bleeding. It was recommended patient repeat upper endoscopy in 2-3 weeks for dilation to prevent recurrent impaction. She was also started on Carafate 10 mL by mouth every 6 hours initially to help with esophageal ulceration.   Today, the patient tells me that before time of impaction she had come off of her Protonix and tells me that when she does that she always has trouble with heartburn and reflux and feels like this was the cause of her food impaction. She has been taking Pantoprazole 40 mg daily since the event and has had no further issues with dysphagia, but is aware that there are findings of a stricture which she needed to be dilated. Patient does tell me that occasionally she feels "bloated" in the top of her stomach which is typically related to eating.   Patient also tells me today that her primary care provider has ""been bugging me" about getting a screening colonoscopy. She has not had one since her 35's..   She denies fever, chills, blood in her stool, melena, weight loss, fatigue, anorexia, nausea, vomiting, reflux or abdominal pain.  Past Medical History:  Diagnosis Date  . Allergy   . Esophageal stricture   . GERD (gastroesophageal reflux disease)   . Hiatal hernia   . Hyperlipidemia   . Hypothyroidism       Past Surgical History:  Procedure Laterality Date  . APPENDECTOMY  1967  . BACK SURGERY    . ESOPHAGOGASTRODUODENOSCOPY N/A 02/08/2017   Procedure: ESOPHAGOGASTRODUODENOSCOPY (EGD);  Surgeon: Manus Gunning, MD;  Location: [facility];  Service: Gastroenterology;  Laterality: N/A;  . ESOPHAGOGASTRODUODENOSCOPY (EGD) WITH ESOPHAGEAL DILATION  2004    Current Outpatient Prescriptions  Medication Sig Dispense Refill  . levothyroxine (SYNTHROID, LEVOTHROID) 75 MCG tablet TAKE 75 MCG BY MOUTH EVERY MORNING    . pantoprazole (PROTONIX) 40 MG tablet Take 40 mg by mouth daily.    . Vitamin D, Ergocalciferol, (DRISDOL) 50000 units CAPS capsule Take 50,000 Units by mouth every 7 (seven) days.     No current facility-administered medications for this visit.     Allergies as of 02/18/2017 - Review Complete 02/18/2017  Allergen Reaction Noted  . Nsaids  01/20/2013    Family History  Problem Relation Age of Onset  . Diabetes Mother   . Breast cancer Mother   . Hyperlipidemia Mother   . Cancer Father     colon/brain  . Early death Father     Social History   Social History  . Marital status: Divorced    Spouse name: N/A  . Number of children: N/A  . 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 Smoking status: Never Smoker  . Smokeless tobacco: Never Used  . Alcohol use No  . Drug use: No  .  Sexual activity: Yes    Partners: Male    Birth control/ protection: None   Other Topics Concern  . Not on file   Social History Narrative   Single. 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   Drinks caffeine.   Wears a seatbelt, smoke detector in the home.   Feels safe in her relationships.    Review of Systems:    Constitutional: No weight loss, fever or chills Skin: No rash  Cardiovascular: No chest pain Respiratory: No SOB Gastrointestinal: See HPI and otherwise negative Genitourinary: No dysuria  Neurological: No  headache Musculoskeletal: No new muscle or joint pain Hematologic: No bleeding  Psychiatric: No history of depression or anxiety   Physical Exam:  Vital signs: BP 136/80   Pulse 72   Ht 5\' 2"  (1.575 m)   Wt 121 lb (54.9 kg)   LMP 02/13/2005   BMI 22.13 kg/m   Constitutional:   Pleasant Caucasian female appears to be in NAD, Well developed, Well nourished, alert and cooperative Head:  Normocephalic and atraumatic. Eyes:   PEERL, EOMI. No icterus. Conjunctiva pink. Ears:  Normal auditory acuity. Neck:  Supple Throat: Oral cavity and pharynx without inflammation, swelling or lesion.  Respiratory: Respirations even and unlabored. Lungs clear to auscultation bilaterally.   No wheezes, crackles, or rhonchi.  Cardiovascular: Normal S1, S2. No MRG. Regular rate and rhythm. No peripheral edema, cyanosis or pallor.  Gastrointestinal:  Soft, nondistended, nontender. No rebound or guarding. Normal bowel sounds. No appreciable masses or hepatomegaly. Rectal:  Not performed.  Msk:  Symmetrical without gross deformities. Without edema, no deformity or joint abnormality.  Neurologic:  Alert and  oriented x4;  grossly normal neurologically.  Skin:   Dry and intact without significant lesions or rashes. Psychiatric: Demonstrates good judgement and reason without abnormal affect or behaviors.  No recent labs.  Assessment: 1. Esophageal stricture/stenosis: Seen at time of recent EGD 02/08/17 2. Dysphagia: Caused by above, no further symptoms since restarting Pantoprazole 40 mg daily 3. GERD: Controlled on Pantoprazole 40 mg daily 4. Screening for colorectal cancer: Patient has not had a colonoscopy since in her 66s, she is past due  Plan: 1. Scheduled patient for an EGD with dilation and a colonoscopy in the [place] with Dr. Henrene Pastor. Did discuss risks, benefits, limitations and alternatives and the patient agrees to proceed. 2. Encouraged patient to continue her Pantoprazole 40 mg daily, 30-60 minutes  before breakfast. 3. Reviewed anti-dysphagia measures including taking small bites, drinking sips of water between bites, chewing well and avoiding distraction while eating as well as a chin tuck technique. 4. Patient to return to clinic per recommendations from Dr. Henrene Pastor after time of procedures.  Sheila Newer, PA-C [facility] 02/18/2017, 1:22 PM  Cc: Howard Pouch A, DO

## 2017-02-19 ENCOUNTER — Telehealth: Payer: Self-pay

## 2017-02-19 NOTE — Telephone Encounter (Signed)
-----   Message from Irene Shipper, MD sent at 02/17/2017  3:54 PM EST ----- Regarding: RE: follow up Direct EGD with dilation in [place]. Thanks   ----- Message ----- From: Algernon Huxley, RN Sent: 02/11/2017   3:46 PM To: Irene Shipper, MD Subject: FW: follow up                                  Dr. Henrene Pastor,  Do you want to see this pt for an OV or just scheduled for an EGD? Please advise.  Thanks, Vaughan Basta  ----- Message ----- From: Manus Gunning, MD Sent: 02/08/2017   9:56 PM To: Algernon Huxley, RN Subject: follow up                                      Christie Beckers,  This is one of Dr. Blanch Media patients from a very long time ago, came in with a food impaction this evening which I removed. She needs a follow up EGD with dilation in 203 weeks or so. If Dr. Henrene Pastor cannot accommodate her let me know I can see what I can do. Thanks

## 2017-02-19 NOTE — Telephone Encounter (Signed)
Left message for pt to call back  °

## 2017-02-20 NOTE — Telephone Encounter (Signed)
Pt was seen in the office by Ellouise Newer PA this week and is scheduled for procedures in the [place].

## 2017-03-01 ENCOUNTER — Telehealth: Payer: Self-pay | Admitting: Internal Medicine

## 2017-03-04 ENCOUNTER — Other Ambulatory Visit: Payer: Self-pay

## 2017-03-04 MED ORDER — NA SULFATE-K SULFATE-MG SULF 17.5-3.13-1.6 GM/177ML PO SOLN: 1.0000 | ORAL | 0 refills | Status: DC

## 2017-03-04 NOTE — Telephone Encounter (Signed)
Resent Suprep to [employer]

## 2017-03-25 ENCOUNTER — Encounter: Payer: Self-pay | Admitting: Internal Medicine

## 2017-03-29 ENCOUNTER — Telehealth: Payer: Self-pay

## 2017-04-05 NOTE — Telephone Encounter (Signed)
Ok thanks. JLL

## 2017-04-05 NOTE — Telephone Encounter (Signed)
Spoke with patient she still has a large hospital bill that she is trying to pay and doesn't want to incur any unnecessary charges. She is not currently having any more problems with swallowing but feels she has not had a colonoscopy in a while and needs to do that. She would like a morning with Dr. Henrene Pastor and will call back in a couple of weeks when the  schedule is out.

## 2017-04-05 NOTE — Telephone Encounter (Signed)
Would not recommend she cancel EGD as we know there is a stricture that needs to be dilated and she does have history of recent food disimpaction-in fact, would recommend we do the EGD and hold on screening colonoscopy if she is trying to decide which to do.   Thanks-JLL

## 2017-04-05 NOTE — Telephone Encounter (Signed)
Left voicemail to call office.

## 2017-04-08 ENCOUNTER — Ambulatory Visit (INDEPENDENT_AMBULATORY_CARE_PROVIDER_SITE_OTHER): Payer: 59 | Admitting: Family Medicine

## 2017-04-08 ENCOUNTER — Encounter: Payer: 59 | Admitting: Internal Medicine

## 2017-04-08 ENCOUNTER — Encounter: Payer: Self-pay | Admitting: Family Medicine

## 2017-04-08 VITALS — BP 137/84 | HR 66 | Temp 97.5°F | Resp 18 | Ht 62.0 in | Wt 122.2 lb

## 2017-04-08 DIAGNOSIS — E7849 Other hyperlipidemia: Secondary | ICD-10-CM

## 2017-04-08 DIAGNOSIS — Z0000 Encounter for general adult medical examination without abnormal findings: Secondary | ICD-10-CM | POA: Diagnosis not present

## 2017-04-08 DIAGNOSIS — E2839 Other primary ovarian failure: Secondary | ICD-10-CM

## 2017-04-08 DIAGNOSIS — E784 Other hyperlipidemia: Secondary | ICD-10-CM

## 2017-04-08 DIAGNOSIS — Z23 Encounter for immunization: Secondary | ICD-10-CM | POA: Diagnosis not present

## 2017-04-08 DIAGNOSIS — Z131 Encounter for screening for diabetes mellitus: Secondary | ICD-10-CM

## 2017-04-08 DIAGNOSIS — Z114 Encounter for screening for human immunodeficiency virus [HIV]: Secondary | ICD-10-CM | POA: Diagnosis not present

## 2017-04-08 DIAGNOSIS — Z130 Encounter for screening for diseases of the blood and blood-forming organs and certain disorders involving the immune mechanism: Secondary | ICD-10-CM | POA: Diagnosis not present

## 2017-04-08 DIAGNOSIS — E038 Other specified hypothyroidism: Secondary | ICD-10-CM | POA: Diagnosis not present

## 2017-04-08 DIAGNOSIS — Z1159 Encounter for screening for other viral diseases: Secondary | ICD-10-CM | POA: Diagnosis not present

## 2017-04-08 LAB — TSH: TSH: 3.07 u[IU]/mL (ref 0.35–4.50)

## 2017-04-08 LAB — LIPID PANEL
CHOL/HDL RATIO: 3
Cholesterol: 224 mg/dL — ABNORMAL HIGH (ref 0–200)
HDL: 68 mg/dL (ref >39.00)
LDL Cholesterol: 140 mg/dL — ABNORMAL HIGH (ref 0–99)
NonHDL: 156.36
TRIGLYCERIDES: 82 mg/dL (ref 0.0–149.0)
VLDL: 16.4 mg/dL (ref 0.0–40.0)

## 2017-04-08 LAB — COMPREHENSIVE METABOLIC PANEL
ALT: 15 U/L (ref 0–35)
AST: 16 U/L (ref 0–37)
Albumin: 4.4 g/dL (ref 3.5–5.2)
Alkaline Phosphatase: 71 U/L (ref 39–117)
BILIRUBIN TOTAL: 0.4 mg/dL (ref 0.2–1.2)
BUN: 12 mg/dL (ref 6–23)
CO2: 28 meq/L (ref 19–32)
CREATININE: 0.65 mg/dL (ref 0.40–1.20)
Calcium: 9.5 mg/dL (ref 8.4–10.5)
Chloride: 105 mEq/L (ref 96–112)
GFR: 97.92 mL/min (ref >60.00)
Glucose, Bld: 112 mg/dL — ABNORMAL HIGH (ref 70–99)
Potassium: 4.6 mEq/L (ref 3.5–5.1)
Sodium: 138 mEq/L (ref 135–145)
TOTAL PROTEIN: 6.9 g/dL (ref 6.0–8.3)

## 2017-04-08 LAB — CBC WITH DIFFERENTIAL/PLATELET
BASOS PCT: 1.4 % (ref 0.0–3.0)
Basophils Absolute: 0.1 10*3/uL (ref 0.0–0.1)
EOS ABS: 0.3 10*3/uL (ref 0.0–0.7)
Eosinophils Relative: 4.3 % (ref 0.0–5.0)
HCT: 40.9 % (ref 36.0–46.0)
Hemoglobin: 13.9 g/dL (ref 12.0–15.0)
Lymphocytes Relative: 22.1 % (ref 12.0–46.0)
Lymphs Abs: 1.6 10*3/uL (ref 0.7–4.0)
MCHC: 34 g/dL (ref 30.0–36.0)
MCV: 93.1 fl (ref 78.0–100.0)
MONO ABS: 0.4 10*3/uL (ref 0.1–1.0)
Monocytes Relative: 5.7 % (ref 3.0–12.0)
NEUTROS ABS: 4.9 10*3/uL (ref 1.4–7.7)
Neutrophils Relative %: 66.5 % (ref 43.0–77.0)
PLATELETS: 312 10*3/uL (ref 150.0–400.0)
RBC: 4.39 Mil/uL (ref 3.87–5.11)
RDW: 12.7 % (ref 11.5–15.5)
WBC: 7.3 10*3/uL (ref 4.0–10.5)

## 2017-04-08 LAB — HEMOGLOBIN A1C: Hgb A1c MFr Bld: 6.1 % (ref 4.6–6.5)

## 2017-04-08 NOTE — Progress Notes (Signed)
Patient ID: Sheila [name], female  DOB: [birth year], 61 y.o.   MRN: [id number] Patient Care Team    Relationship Specialty Notifications Start End  Ma Hillock, DO PCP - General Family Medicine  02/08/17   Ena Dawley, MD Consulting Physician Obstetrics and Gynecology  02/11/17   Irene Shipper, MD Consulting Physician Gastroenterology  02/11/17     Subjective:  Sheila [name] is a 61 y.o.  Female  present for CPE. All past medical history, surgical history, allergies, family history, immunizations, medications and social history were updated in the electronic medical record today. All recent labs, ED visits and hospitalizations within the last year were reviewed.  Health maintenance:  Colonoscopy: scheduled with Dr. Henrene Pastor.  Mammogram:  Breast cancer in family. Yearly exams through GYN.  Cervical cancer screening: Routine exam through GYN. Immunizations: tdap 2015, Influenza UTD (encouraged yearly), PNA series start at 54,  Shingrix #1 provided today.  Infectious disease screening: HIV and Hep C pt agreeable to testing today.  DEXA: low vit d, estrogen deficient. Order placed today.  Assistive device: none Oxygen OIZ:TIWP Patient has a Dental home. Hospitalizations/ED visits: none  Depression screen [facility] 2/9 04/08/2017 02/08/2017  Decreased Interest 0 0  Down, Depressed, Hopeless 0 0  PHQ - 2 Score 0 0    Current Exercise Habits: The patient does not participate in regular exercise at present Exercise limited by: None identified   Immunization History  Administered Date(s) Administered  . Zoster Recombinat (Shingrix) 04/08/2017     Past Medical History:  Diagnosis Date  . Allergy   . Esophageal stricture   . GERD (gastroesophageal reflux disease)   . Hiatal hernia   . Hyperlipidemia   . Hypothyroidism    Allergies  Allergen Reactions  . Nsaids    Past Surgical History:  Procedure Laterality Date  . APPENDECTOMY  1967  . BACK SURGERY    .  ESOPHAGOGASTRODUODENOSCOPY N/A 02/08/2017   Procedure: ESOPHAGOGASTRODUODENOSCOPY (EGD);  Surgeon: Manus Gunning, MD;  Location: [facility];  Service: Gastroenterology;  Laterality: N/A;  . ESOPHAGOGASTRODUODENOSCOPY (EGD) WITH ESOPHAGEAL DILATION  2004   Family History  Problem Relation Age of Onset  . Diabetes Mother   . Breast cancer Mother   . Hyperlipidemia Mother   . Cancer Father     colon/brain  . Early death Father    Social History   Social History  . Marital status: Divorced    Spouse name: N/A  . Number of children: N/A  . 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 Smoking status: Never Smoker  . Smokeless tobacco: Never Used  . Alcohol use No  . Drug use: No  . Sexual activity: Yes    Partners: Male    Birth control/ protection: None   Other Topics Concern  . Not on file   Social History Narrative   Single. 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   Drinks caffeine.   Wears a seatbelt, smoke detector in the home.   Feels safe in her relationships.   Allergies as of 04/08/2017      Reactions   Nsaids       Medication List       Accurate as of 04/08/17 12:47 PM. Always use your most recent med list.          levothyroxine 75 MCG tablet Commonly known as:  SYNTHROID, LEVOTHROID TAKE 75 MCG  BY MOUTH EVERY MORNING   Na Sulfate-K Sulfate-Mg Sulf 17.5-3.13-1.6 GM/180ML Soln Take 1 kit by mouth as directed.   pantoprazole 40 MG tablet Commonly known as:  PROTONIX Take 40 mg by mouth daily.   Vitamin D (Ergocalciferol) 50000 units Caps capsule Commonly known as:  DRISDOL Take 50,000 Units by mouth every 7 (seven) days.       No results found for this or any previous visit (from the past 2160 hour(s)).  No results found.  ROS: 14 pt review of systems performed and negative (unless mentioned in an HPI)  Objective: BP 137/84 (BP Location: Right Arm, Patient Position: Sitting, Cuff  Size: Normal)   Pulse 66   Temp 97.5 F (36.4 C)   Resp 18   Ht 5' 2" (1.575 m)   Wt 122 lb 4 oz (55.5 kg)   LMP 02/13/2005   SpO2 100%   BMI 22.36 kg/m  Gen: Afebrile. No acute distress. Nontoxic in appearance, well-developed, well-nourished,  Pleasant caucasian female.  HENT: AT. [place]. Bilateral TM visualized and normal in appearance, normal external auditory canal. MMM, no oral lesions, adequate dentition. Bilateral nares within normal limits. Throat without erythema, ulcerations or exudates. no Cough on exam, no hoarseness on exam. Eyes:Pupils Equal Round Reactive to light, Extraocular movements intact,  Conjunctiva without redness, discharge or icterus. Neck/lymp/endocrine: Supple,no lymphadenopathy, no thyromegaly CV: RRR, noedema, +2/4 P posterior tibialis pulses. no carotid bruits. No JVD. Chest: CTAB, no wheeze, rhonchi or crackles. normal Respiratory effort. good Air movement. Abd: Soft. flat. NTND. BS present. no Masses palpated. No hepatosplenomegaly. No rebound tenderness or guarding. Skin: no rashes, purpura or petechiae. Warm and well-perfused. Skin intact. Neuro/Msk:  Normal gait. PERLA. EOMi. Alert. Oriented x3.  Cranial nerves II through XII intact. Muscle strength 5/5 upper/lower extremity. DTRs equal bilaterally. Psych: Normal affect, dress and demeanor. Normal speech. Normal thought content and judgment.   Assessment/plan: Sheila [name] is a 60 y.o. female present for CPE. Other specified hypothyroidism - refills on synthroid will be prescribed after results received.  - TSH Other hyperlipidemia - pt reports elevated cholesterol in the past, nad was prescribed Crestor, but she did not want to take the medicine.  - Lipid panel - Comp Met (CMET) - HgB A1c Screening for diabetes mellitus - HgB A1c Screening for iron deficiency anemia - CBC w/Diff Estrogen deficiency - continue daily vit d supplement 1000-2000 u daily. H/O of vit d deficiency with  noncompliance.  - DG Bone Density; Future Encounter for screening for HIV - pt agreeable to ID screening today.  - HIV antibody Need for hepatitis C screening test - Hepatitis C antibody Immunization due - Varicella-zoster vaccine IM (Shingrix) #1--> schedule #2 in 2-3 months for nurse visit.  Encounter for preventive health examination Patient was encouraged to exercise greater than 150 minutes a week. Patient was encouraged to choose a diet filled with fresh fruits and vegetables, and lean meats. AVS provided to patient today for education/recommendation on gender specific health and safety maintenance. Colonoscopy scheduled with Dr. Perry.  Routine mam and gyn Dr. Stringer.  Immunizations UTD. Shingrix provided today.  ID screens completed today.  DEXA ordered today.  Return in about 1 year (around 04/08/2018) for CPE.  Electronically signed by: Renee Kuneff, DO [facility]  

## 2017-04-08 NOTE — Patient Instructions (Addendum)
Take 2000u of vit d daily.  You will need a nurse visit in 2.5 months to get second dose of shingle shot.   Bone density ordered.   Health Maintenance, Female Adopting a healthy lifestyle and getting preventive care can go a long way to promote health and wellness. Talk with your health care provider about what schedule of regular examinations is right for you. This is a good chance for you to check in with your provider about disease prevention and staying healthy. In between checkups, there are plenty of things you can do on your own. Experts have done a lot of research about which lifestyle changes and preventive measures are most likely to keep you healthy. Ask your health care provider for more information. Weight and diet Eat a healthy diet  Be sure to include plenty of vegetables, fruits, low-fat dairy products, and lean protein.  Do not eat a lot of foods high in solid fats, added sugars, or salt.  Get regular exercise. This is one of the most important things you can do for your health.  Most adults should exercise for at least 150 minutes each week. The exercise should increase your heart rate and make you sweat (moderate-intensity exercise).  Most adults should also do strengthening exercises at least twice a week. This is in addition to the moderate-intensity exercise. Maintain a healthy weight  Body mass index (BMI) is a measurement that can be used to identify possible weight problems. It estimates body fat based on height and weight. Your health care provider can help determine your BMI and help you achieve or maintain a healthy weight.  For females 16 years of age and older:  A BMI below 18.5 is considered underweight.  A BMI of 18.5 to 24.9 is normal.  A BMI of 25 to 29.9 is considered overweight.  A BMI of 30 and above is considered obese. Watch levels of cholesterol and blood lipids  You should start having your blood tested for lipids and cholesterol at 63 years  of age, then have this test every 5 years.  You may need to have your cholesterol levels checked more often if:  Your lipid or cholesterol levels are high.  You are older than 63 years of age.  You are at high risk for heart disease. Cancer screening Lung Cancer  Lung cancer screening is recommended for adults 77-45 years old who are at high risk for lung cancer because of a history of smoking.  A yearly low-dose CT scan of the lungs is recommended for people who:  Currently smoke.  Have quit within the past 15 years.  Have at least a 30-pack-year history of smoking. A pack year is smoking an average of one pack of cigarettes a day for 1 year.  Yearly screening should continue until it has been 15 years since you quit.  Yearly screening should stop if you develop a health problem that would prevent you from having lung cancer treatment. Breast Cancer  Practice breast self-awareness. This means understanding how your breasts normally appear and feel.  It also means doing regular breast self-exams. Let your health care provider know about any changes, no matter how small.  If you are in your 20s or 30s, you should have a clinical breast exam (CBE) by a health care provider every 1-3 years as part of a regular health exam.  If you are 15 or older, have a CBE every year. Also consider having a breast X-ray (mammogram) every year.  If you have a family history of breast cancer, talk to your health care provider about genetic screening.  If you are at high risk for breast cancer, talk to your health care provider about having an MRI and a mammogram every year.  Breast cancer gene (BRCA) assessment is recommended for women who have family members with BRCA-related cancers. BRCA-related cancers include:  Breast.  Ovarian.  Tubal.  Peritoneal cancers.  Results of the assessment will determine the need for genetic counseling and BRCA1 and BRCA2 testing. Cervical Cancer  Your  health care provider may recommend that you be screened regularly for cancer of the pelvic organs (ovaries, uterus, and vagina). This screening involves a pelvic examination, including checking for microscopic changes to the surface of your cervix (Pap test). You may be encouraged to have this screening done every 3 years, beginning at age 26.  For women ages 41-65, health care providers may recommend pelvic exams and Pap testing every 3 years, or they may recommend the Pap and pelvic exam, combined with testing for human papilloma virus (HPV), every 5 years. Some types of HPV increase your risk of cervical cancer. Testing for HPV may also be done on women of any age with unclear Pap test results.  Other health care providers may not recommend any screening for nonpregnant women who are considered low risk for pelvic cancer and who do not have symptoms. Ask your health care provider if a screening pelvic exam is right for you.  If you have had past treatment for cervical cancer or a condition that could lead to cancer, you need Pap tests and screening for cancer for at least 20 years after your treatment. If Pap tests have been discontinued, your risk factors (such as having a new sexual partner) need to be reassessed to determine if screening should resume. Some women have medical problems that increase the chance of getting cervical cancer. In these cases, your health care provider may recommend more frequent screening and Pap tests. Colorectal Cancer  This type of cancer can be detected and often prevented.  Routine colorectal cancer screening usually begins at 63 years of age and continues through 63 years of age.  Your health care provider may recommend screening at an earlier age if you have risk factors for colon cancer.  Your health care provider may also recommend using home test kits to check for hidden blood in the stool.  A small camera at the end of a tube can be used to examine your  colon directly (sigmoidoscopy or colonoscopy). This is done to check for the earliest forms of colorectal cancer.  Routine screening usually begins at age 49.  Direct examination of the colon should be repeated every 5-10 years through 63 years of age. However, you may need to be screened more often if early forms of precancerous polyps or small growths are found. Skin Cancer  Check your skin from head to toe regularly.  Tell your health care provider about any new moles or changes in moles, especially if there is a change in a mole's shape or color.  Also tell your health care provider if you have a mole that is larger than the size of a pencil eraser.  Always use sunscreen. Apply sunscreen liberally and repeatedly throughout the day.  Protect yourself by wearing long sleeves, pants, a wide-brimmed hat, and sunglasses whenever you are outside. Heart disease, diabetes, and high blood pressure  High blood pressure causes heart disease and increases the risk  of stroke. High blood pressure is more likely to develop in:  People who have blood pressure in the high end of the normal range (130-139/85-89 mm Hg).  People who are overweight or obese.  People who are African American.  If you are 68-63 years of age, have your blood pressure checked every 3-5 years. If you are 73 years of age or older, have your blood pressure checked every year. You should have your blood pressure measured twice-once when you are at a hospital or clinic, and once when you are not at a hospital or clinic. Record the average of the two measurements. To check your blood pressure when you are not at a hospital or clinic, you can use:  An automated blood pressure machine at a pharmacy.  A home blood pressure monitor.  If you are between 45 years and 38 years old, ask your health care provider if you should take aspirin to prevent strokes.  Have regular diabetes screenings. This involves taking a blood sample to  check your fasting blood sugar level.  If you are at a normal weight and have a low risk for diabetes, have this test once every three years after 63 years of age.  If you are overweight and have a high risk for diabetes, consider being tested at a younger age or more often. Preventing infection Hepatitis B  If you have a higher risk for hepatitis B, you should be screened for this virus. You are considered at high risk for hepatitis B if:  You were born in a country where hepatitis B is common. Ask your health care provider which countries are considered high risk.  Your parents were born in a high-risk country, and you have not been immunized against hepatitis B (hepatitis B vaccine).  You have HIV or AIDS.  You use needles to inject street drugs.  You live with someone who has hepatitis B.  You have had sex with someone who has hepatitis B.  You get hemodialysis treatment.  You take certain medicines for conditions, including cancer, organ transplantation, and autoimmune conditions. Hepatitis C  Blood testing is recommended for:  Everyone born from [birth year] through 1967.  Anyone with known risk factors for hepatitis C. Sexually transmitted infections (STIs)  You should be screened for sexually transmitted infections (STIs) including gonorrhea and chlamydia if:  You are sexually active and are younger than 63 years of age.  You are older than 63 years of age and your health care provider tells you that you are at risk for this type of infection.  Your sexual activity has changed since you were last screened and you are at an increased risk for chlamydia or gonorrhea. Ask your health care provider if you are at risk.  If you do not have HIV, but are at risk, it may be recommended that you take a prescription medicine daily to prevent HIV infection. This is called pre-exposure prophylaxis (PrEP). You are considered at risk if:  You are sexually active and do not regularly use  condoms or know the HIV status of your partner(s).  You take drugs by injection.  You are sexually active with a partner who has HIV. Talk with your health care provider about whether you are at high risk of being infected with HIV. If you choose to begin PrEP, you should first be tested for HIV. You should then be tested every 3 months for as long as you are taking PrEP. Pregnancy  If you are  premenopausal and you may become pregnant, ask your health care provider about preconception counseling.  If you may become pregnant, take 400 to 800 micrograms (mcg) of folic acid every day.  If you want to prevent pregnancy, talk to your health care provider about birth control (contraception). Osteoporosis and menopause  Osteoporosis is a disease in which the bones lose minerals and strength with aging. This can result in serious bone fractures. Your risk for osteoporosis can be identified using a bone density scan.  If you are 58 years of age or older, or if you are at risk for osteoporosis and fractures, ask your health care provider if you should be screened.  Ask your health care provider whether you should take a calcium or vitamin D supplement to lower your risk for osteoporosis.  Menopause may have certain physical symptoms and risks.  Hormone replacement therapy may reduce some of these symptoms and risks. Talk to your health care provider about whether hormone replacement therapy is right for you. Follow these instructions at home:  Schedule regular health, dental, and eye exams.  Stay current with your immunizations.  Do not use any tobacco products including cigarettes, chewing tobacco, or electronic cigarettes.  If you are pregnant, do not drink alcohol.  If you are breastfeeding, limit how much and how often you drink alcohol.  Limit alcohol intake to no more than 1 drink per day for nonpregnant women. One drink equals 12 ounces of beer, 5 ounces of wine, or 1 ounces of  hard liquor.  Do not use street drugs.  Do not share needles.  Ask your health care provider for help if you need support or information about quitting drugs.  Tell your health care provider if you often feel depressed.  Tell your health care provider if you have ever been abused or do not feel safe at home. This information is not intended to replace advice given to you by your health care provider. Make sure you discuss any questions you have with your health care provider. Document Released: 06/18/2011 Document Revised: 05/10/2016 Document Reviewed: 09/06/2015 Elsevier Interactive Patient Education  2017 [employer].

## 2017-04-09 ENCOUNTER — Telehealth: Payer: Self-pay | Admitting: Family Medicine

## 2017-04-09 ENCOUNTER — Other Ambulatory Visit: Payer: Self-pay | Admitting: *Deleted

## 2017-04-09 DIAGNOSIS — R7303 Prediabetes: Secondary | ICD-10-CM

## 2017-04-09 DIAGNOSIS — R7309 Other abnormal glucose: Secondary | ICD-10-CM | POA: Insufficient documentation

## 2017-04-09 LAB — HEPATITIS C ANTIBODY: HCV AB: NEGATIVE

## 2017-04-09 LAB — HIV ANTIBODY (ROUTINE TESTING W REFLEX): HIV: NONREACTIVE

## 2017-04-09 MED ORDER — LEVOTHYROXINE SODIUM 75 MCG PO TABS: ORAL_TABLET | ORAL | 3 refills | Status: DC

## 2017-04-09 NOTE — Telephone Encounter (Signed)
Spoke with patient reviewed lab results and instructions   Patient verbalized understanding of the above.

## 2017-04-09 NOTE — Telephone Encounter (Signed)
Please call pt: - her labs all look with the following exceptions:      - Her "bad" cholesterol is mildly elevated at 140, ideally would like this below 130. She could just add fish oil (at night if it bothers her), krill oil or red yeast rice supplements. I do not feel she needs a statin/prescribed medication at this time.       - Her diabetes screen is also mildly elevated at 6.1, this should be <5.7. This is "prediabetes" range and should be followed up in 6 months. To lower her a1c she should try to decrease carbohydrates (less white pasta, bread, potatoes, rice, soda, sugar) and increase exercise to > 150 minutes a week.  - I have refilled her thyroid medicine for her.  f/u 6 months

## 2017-04-09 NOTE — Telephone Encounter (Signed)
Tried to call patient voice mail not set up unable to leave message. 

## 2017-04-30 ENCOUNTER — Encounter: Payer: Self-pay | Admitting: Family Medicine

## 2017-05-06 ENCOUNTER — Telehealth: Payer: Self-pay

## 2017-05-06 NOTE — Telephone Encounter (Signed)
Patient called stating that she can not get Bone Density test done due to Insurance and will call back when she gets straightened out.

## 2017-06-17 ENCOUNTER — Ambulatory Visit (INDEPENDENT_AMBULATORY_CARE_PROVIDER_SITE_OTHER): Payer: 59 | Admitting: Family Medicine

## 2017-06-17 DIAGNOSIS — Z23 Encounter for immunization: Secondary | ICD-10-CM

## 2017-06-17 NOTE — Progress Notes (Addendum)
Patient came into office today for #2 shingrix vaccine.  Vaccine given IM and patient tolerated well.   Electronically Signed by: Howard Pouch, DO [place] [facility] [place]

## 2017-11-12 LAB — HM MAMMOGRAPHY

## 2017-11-21 LAB — HM PAP SMEAR

## 2018-02-17 ENCOUNTER — Ambulatory Visit: Payer: BLUE CROSS/BLUE SHIELD | Admitting: Family Medicine

## 2018-02-17 ENCOUNTER — Encounter: Payer: Self-pay | Admitting: Family Medicine

## 2018-02-17 ENCOUNTER — Telehealth: Payer: Self-pay | Admitting: Family Medicine

## 2018-02-17 VITALS — BP 138/70 | HR 73 | Temp 97.8°F | Ht 62.0 in | Wt 124.8 lb

## 2018-02-17 DIAGNOSIS — E039 Hypothyroidism, unspecified: Secondary | ICD-10-CM

## 2018-02-17 LAB — TSH: TSH: 3.32 u[IU]/mL (ref 0.35–4.50)

## 2018-02-17 MED ORDER — LEVOTHYROXINE SODIUM 75 MCG PO TABS: ORAL_TABLET | ORAL | 3 refills | Status: DC

## 2018-02-17 NOTE — Telephone Encounter (Signed)
Please inform patient her thyroid levels are normal. I have refilled her medications for 1 year.

## 2018-02-17 NOTE — Patient Instructions (Signed)
It was nice to see you today.  Follow up with your physical and fasting labs in May.   I will refill your thyroid meds after I get your results.

## 2018-02-17 NOTE — Progress Notes (Signed)
Sheila [name] , 1956/08/05, 62 y.o., female MRN: [id number] Patient Care Team    Relationship Specialty Notifications Start End  Ma Hillock, DO PCP - General Family Medicine  02/08/17   Ena Dawley, MD Consulting Physician Obstetrics and Gynecology  02/11/17   Irene Shipper, MD Consulting Physician Gastroenterology  02/11/17     Chief Complaint  Patient presents with  . Medication Refill    Levothyroxine     Subjective: Pt presents for an OV for medication refill. She is feeling well. Denies palpitations, flushing, fatigue or bowel changes.   Depression screen [facility] 2/9 04/08/2017 02/08/2017  Decreased Interest 0 0  Down, Depressed, Hopeless 0 0  PHQ - 2 Score 0 0    Allergies  Allergen Reactions  . Nsaids    Social History   Tobacco Use  . Smoking status: Never Smoker  . Smokeless tobacco: Never Used  Substance Use Topics  . Alcohol use: No   Past Medical History:  Diagnosis Date  . Allergy   . Esophageal stricture   . GERD (gastroesophageal reflux disease)   . Hiatal hernia   . Hyperlipidemia   . Hypothyroidism    Past Surgical History:  Procedure Laterality Date  . APPENDECTOMY  1967  . BACK SURGERY    . ESOPHAGOGASTRODUODENOSCOPY N/A 02/08/2017   Procedure: ESOPHAGOGASTRODUODENOSCOPY (EGD);  Surgeon: Manus Gunning, MD;  Location: [facility];  Service: Gastroenterology;  Laterality: N/A;  . ESOPHAGOGASTRODUODENOSCOPY (EGD) WITH ESOPHAGEAL DILATION  2004   Family History  Problem Relation Age of Onset  . Diabetes Mother   . Breast cancer Mother   . Hyperlipidemia Mother   . Cancer Father        colon/brain  . Early death Father    Allergies as of 02/17/2018      Reactions   Nsaids       Medication List        Accurate as of 02/17/18  9:18 AM. Always use your most recent med list.          levothyroxine 75 MCG tablet Commonly known as:  SYNTHROID, LEVOTHROID TAKE 75 MCG BY MOUTH EVERY MORNING   pantoprazole 40 MG  tablet Commonly known as:  PROTONIX Take 40 mg by mouth daily.   Vitamin D (Ergocalciferol) 50000 units Caps capsule Commonly known as:  DRISDOL Take 50,000 Units by mouth every 7 (seven) days.       All past medical history, surgical history, allergies, family history, immunizations andmedications were updated in the EMR today and reviewed under the history and medication portions of their EMR.     ROS: Negative, with the exception of above mentioned in HPI   Objective:  BP 138/70 (BP Location: Right Arm, Patient Position: Sitting, Cuff Size: Normal)   Pulse 73   Temp 97.8 F (36.6 C) (Oral)   Ht 5\' 2"  (1.575 m)   Wt 124 lb 12.8 oz (56.6 kg)   LMP 02/13/2005   SpO2 97%   BMI 22.83 kg/m  Body mass index is 22.83 kg/m. Gen: Afebrile. No acute distress. Nontoxic in appearance, well developed, well nourished.  HENT: AT. [place]. MMM, no oral lesions.  Eyes:Pupils Equal Round Reactive to light, Extraocular movements intact,  Conjunctiva without redness, discharge or icterus. Neck/lymp/endocrine: Supple,no lymphadenopathy. No thyromegaly CV: RRR Chest: CTAB, no wheeze or crackles.   No exam data present No results found. No results found for this or any previous visit (from the past 24 hour(s)).  Assessment/Plan:  Sheila [name] is a 62 y.o. female present for OV for  Hypothyroidism, unspecified type - feeling well. Needs refills today on synthroid - TSH collected today.   Reviewed expectations re: course of current medical issues.  Discussed self-management of symptoms.  Outlined signs and symptoms indicating need for more acute intervention.  Patient verbalized understanding and all questions were answered.  Patient received an After-Visit Summary.    No orders of the defined types were placed in this encounter.   > 15 minutes spent with patient, >50% of time spent face to face counseling and/or coordinating care.    Note is dictated utilizing voice  recognition software. Although note has been proof read prior to signing, occasional typographical errors still can be missed. If any questions arise, please do not hesitate to call for verification.   electronically signed by:  Howard Pouch, DO  [place]

## 2018-02-18 NOTE — Telephone Encounter (Signed)
Spoke with patient reviewed lab results and instructions. Patient verbalized understanding. 

## 2018-03-14 ENCOUNTER — Ambulatory Visit: Payer: BLUE CROSS/BLUE SHIELD | Admitting: Family Medicine

## 2018-03-14 ENCOUNTER — Encounter: Payer: Self-pay | Admitting: Family Medicine

## 2018-03-14 VITALS — BP 138/77 | HR 78 | Temp 97.5°F | Ht 62.0 in | Wt 122.4 lb

## 2018-03-14 DIAGNOSIS — M545 Low back pain: Secondary | ICD-10-CM | POA: Diagnosis not present

## 2018-03-14 DIAGNOSIS — M5416 Radiculopathy, lumbar region: Secondary | ICD-10-CM | POA: Insufficient documentation

## 2018-03-14 MED ORDER — PANTOPRAZOLE SODIUM 40 MG PO TBEC: 40.0000 mg | DELAYED_RELEASE_TABLET | Freq: Every day | ORAL | 0 refills | Status: DC

## 2018-03-14 MED ORDER — METHYLPREDNISOLONE ACETATE 80 MG/ML IJ SUSP
80.0000 mg | Freq: Once | INTRAMUSCULAR | Status: AC
  Administered 2018-03-14: 80 mg via INTRAMUSCULAR

## 2018-03-14 MED ORDER — TRAMADOL HCL 50 MG PO TABS: 50.0000 mg | ORAL_TABLET | Freq: Three times a day (TID) | ORAL | 0 refills | Status: DC | PRN

## 2018-03-14 MED ORDER — PREDNISONE 20 MG PO TABS: ORAL_TABLET | ORAL | 0 refills | Status: DC

## 2018-03-14 MED ORDER — CYCLOBENZAPRINE HCL 5 MG PO TABS: 5.0000 mg | ORAL_TABLET | Freq: Two times a day (BID) | ORAL | 0 refills | Status: DC | PRN
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
## 2018-03-14 NOTE — Progress Notes (Signed)
Sheila [name] , Apr 26, 1956, 62 y.o., female MRN: [id number] Patient Care Team    Relationship Specialty Notifications Start End  Ma Hillock, DO PCP - General Family Medicine  02/08/17   Ena Dawley, MD Consulting Physician Obstetrics and Gynecology  02/11/17   Irene Shipper, MD Consulting Physician Gastroenterology  02/11/17     Chief Complaint  Patient presents with  . Leg Pain    Pt c/o of left leg pain rated 6/10 X 1 week . she states that the pain started with a back pain that migrated to her legs. Sitting makes it worse while standing makes the pain better. Pt states that she had past history of pinch nerve. she has try ICE, Heat and tynelol for relief.     Subjective: Pt presents for an OV with complaints of left leg pain of 1 week  duration.  Associated symptoms include deep ache feeling from her buttocks to ankle. Started in her lower back last week. She had a rough weekend last weekend with the pain. 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 She denies any weakness of her LE today. She denies any bladder or bowel issues. She has been taking aleve BID and tylenol for pain.  Any NSAIDS besides Aleve tend to cause her severe abd pain, including stronger narcotics as well.    Depression screen [facility] 2/9 04/08/2017 02/08/2017  Decreased Interest 0 0  Down, Depressed, Hopeless 0 0  PHQ - 2 Score 0 0    Allergies  Allergen Reactions  . Nsaids    Social History   Tobacco Use  . Smoking status: Never Smoker  . Smokeless tobacco: Never Used  Substance Use Topics  . Alcohol use: No   Past Medical History:  Diagnosis Date  . Allergy   . Esophageal stricture   . GERD (gastroesophageal reflux disease)   . Hiatal hernia   . Hyperlipidemia   . Hypothyroidism    Past Surgical History:  Procedure Laterality Date  . APPENDECTOMY  1967  . BACK SURGERY    .  ESOPHAGOGASTRODUODENOSCOPY N/A 02/08/2017   Procedure: ESOPHAGOGASTRODUODENOSCOPY (EGD);  Surgeon: Manus Gunning, MD;  Location: [facility];  Service: Gastroenterology;  Laterality: N/A;  . ESOPHAGOGASTRODUODENOSCOPY (EGD) WITH ESOPHAGEAL DILATION  2004   Family History  Problem Relation Age of Onset  . Diabetes Mother   . Breast cancer Mother   . Hyperlipidemia Mother   . Cancer Father        colon/brain  . Early death Father    Allergies as of 03/14/2018      Reactions   Nsaids       Medication List        Accurate as of 03/14/18  2:09 PM. Always use your most recent med list.          levothyroxine 75 MCG tablet Commonly known as:  SYNTHROID, LEVOTHROID TAKE 75 MCG BY MOUTH EVERY MORNING   pantoprazole 40 MG tablet Commonly known as:  PROTONIX Take 40 mg by mouth daily.   Vitamin D (Ergocalciferol) 50000 units Caps capsule Commonly known as:  DRISDOL Take 50,000 Units by mouth every 7 (seven) days.       All past medical history, surgical history, allergies, family history, immunizations andmedications were updated in the EMR today and reviewed under the history and medication portions of their EMR.  ROS: Negative, with the exception of above mentioned in HPI   Objective:  BP 138/77 (BP Location: Left Arm, Patient Position: Sitting, Cuff Size: Normal)   Pulse 78   Temp (!) 97.5 F (36.4 C) (Oral)   Ht 5\' 2"  (1.575 m)   Wt 122 lb 6.4 oz (55.5 kg)   LMP 02/13/2005   SpO2 97%   BMI 22.39 kg/m  Body mass index is 22.39 kg/m. Gen: Afebrile. No acute distress. Nontoxic in appearance, well developed, well nourished.  HENT: AT. [place].MMM Eyes:Pupils Equal Round Reactive to light, Extraocular movements intact,  Conjunctiva without redness, discharge or icterus. MSK: No erythema.  No soft tissue swelling lumbar spine.  Mild tenderness to palpation left SI area.  Full range of motion with mild discomfort on left side bending.  Straight leg raises  bilaterally.  Uncomfortable with bilateral [place] with limited hip external rotation bilaterally. Skin: no rashes, purpura or petechiae.  Neuro: Normal gait, although guarded when transitioning. PERLA. EOMi. Alert. Oriented x3. Cranial nerves II through XII intact. Muscle strength 5/5 bilateral ext, with the exception right flexion 4+/4 and left hip flexion 4+/5 extremity. Neg SLR bilaterally, neg FABRE bilaterally.  Psych: Normal affect, dress and demeanor. Normal speech. Normal thought content and judgment.  No exam data present No results found. No results found for this or any previous visit (from the past 24 hour(s)).  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 IM Depo-Medrol injection provided today.  Steroid taper to be started tomorrow. -Flexeril prescribed.  Continue NSAID therapy with restart of Protonix daily. -Rest.  Start stretches in a couple days using pain as her guide. - methylPREDNISolone acetate (DEPO-MEDROL) injection 80 mg -Tramadol prescribed for severe pain, short course. -We will up 2-4 weeks if not improving, sooner if worsening.   Reviewed expectations re: course of current medical issues.  Discussed self-management of symptoms.  Outlined signs and symptoms indicating need for more acute intervention.  Patient verbalized understanding and all questions were answered.  Patient received an After-Visit Summary.    No orders of the defined types were placed in this encounter.    Note is dictated utilizing voice recognition software. Although note has been proof read prior to signing, occasional typographical errors still can be missed. If any questions arise, please do not hesitate to call for verification.   electronically signed by:  Howard Pouch, DO  [place]

## 2018-03-28 ENCOUNTER — Telehealth: Payer: Self-pay | Admitting: Family Medicine

## 2018-03-28 NOTE — Telephone Encounter (Signed)
Copied from [place] [id number]. Topic: Quick Communication - See Telephone Encounter >> Mar 28, 2018  9:00 AM Robina Ade, Helene Kelp D wrote: CRM for notification. See Telephone encounter for: 03/28/18. Patient called and said that she is still not feeling better since last visit. She would like to talk to [place] or Dr. Raoul Pitch about this, please call patient back.

## 2018-03-28 NOTE — Telephone Encounter (Signed)
Spoke with patient she states pain is not gone away. Offered patient an appt for today she states she cant come in until Monday.scheduled patient to be seen on Monday advised patient if pain gets worse she needs to go to [facility] or ED patient verbalized understanding.

## 2018-03-31 ENCOUNTER — Encounter: Payer: Self-pay | Admitting: Family Medicine

## 2018-03-31 ENCOUNTER — Ambulatory Visit: Payer: BLUE CROSS/BLUE SHIELD | Admitting: Family Medicine

## 2018-03-31 ENCOUNTER — Telehealth: Payer: Self-pay | Admitting: Family Medicine

## 2018-03-31 VITALS — BP 143/82 | HR 93 | Temp 98.0°F | Resp 20 | Ht 62.0 in | Wt 126.0 lb

## 2018-03-31 DIAGNOSIS — M5416 Radiculopathy, lumbar region: Secondary | ICD-10-CM | POA: Diagnosis not present

## 2018-03-31 MED ORDER — PREGABALIN 75 MG PO CAPS: 75.0000 mg | ORAL_CAPSULE | Freq: Two times a day (BID) | ORAL | 2 refills | Status: DC

## 2018-03-31 MED ORDER — GABAPENTIN 100 MG PO CAPS: 100.0000 mg | ORAL_CAPSULE | Freq: Three times a day (TID) | ORAL | 1 refills | Status: DC

## 2018-03-31 NOTE — Patient Instructions (Signed)
Trial of lyrica.  Urgent referral to ortho. If they are unable to get you in quickly, then please call back and we will try to move forward on work up.     Sciatica Sciatica is pain, numbness, weakness, or tingling along your sciatic nerve. The sciatic nerve starts in the lower back and goes down the back of each leg. Sciatica happens when this nerve is pinched or has pressure put on it. Sciatica usually goes away on its own or with treatment. Sometimes, sciatica may keep coming back (recur). Follow these instructions at home: Medicines  Take over-the-counter and prescription medicines only as told by your doctor.  Do not drive or use heavy machinery while taking prescription pain medicine. Managing pain  If directed, put ice on the affected area. ? Put ice in a plastic bag. ? Place a towel between your skin and the bag. ? Leave the ice on for 20 minutes, 2-3 times a day.  After icing, apply heat to the affected area before you exercise or as often as told by your doctor. Use the heat source that your doctor tells you to use, such as a moist heat pack or a heating pad. ? Place a towel between your skin and the heat source. ? Leave the heat on for 20-30 minutes. ? Remove the heat if your skin turns bright red. This is especially important if you are unable to feel pain, heat, or cold. You may have a greater risk of getting burned. Activity  Return to your normal activities as told by your doctor. Ask your doctor what activities are safe for you. ? Avoid activities that make your sciatica worse.  Take short rests during the day. Rest in a lying or standing position. This is usually better than sitting to rest. ? When you rest for a long time, do some physical activity or stretching between periods of rest. ? Avoid sitting for a long time without moving. Get up and move around at least one time each hour.  Exercise and stretch regularly, as told by your doctor.  Do not lift anything  that is heavier than 10 lb (4.5 kg) while you have symptoms of sciatica. ? Avoid lifting heavy things even when you do not have symptoms. ? Avoid lifting heavy things over and over.  When you lift objects, always lift in a way that is safe for your body. To do this, you should: ? Bend your knees. ? Keep the object close to your body. ? Avoid twisting. General instructions  Use good posture. ? Avoid leaning forward when you are sitting. ? Avoid hunching over when you are standing.  Stay at a healthy weight.  Wear comfortable shoes that support your feet. Avoid wearing high heels.  Avoid sleeping on a mattress that is too soft or too hard. You might have less pain if you sleep on a mattress that is firm enough to support your back.  Keep all follow-up visits as told by your doctor. This is important. Contact a doctor if:  You have pain that: ? Wakes you up when you are sleeping. ? Gets worse when you lie down. ? Is worse than the pain you have had in the past. ? Lasts longer than 4 weeks.  You lose weight for without trying. Get help right away if:  You cannot control when you pee (urinate) or poop (have a bowel movement).  You have weakness in any of these areas and it gets worse. ? Lower  back. ? Lower belly (pelvis). ? Butt (buttocks). ? Legs.  You have redness or swelling of your back.  You have a burning feeling when you pee. This information is not intended to replace advice given to you by your health care provider. Make sure you discuss any questions you have with your health care provider. Document Released: 09/11/2008 Document Revised: 05/10/2016 Document Reviewed: 08/12/2015 Elsevier Interactive Patient Education  [employer].

## 2018-03-31 NOTE — Telephone Encounter (Signed)
Patient notified

## 2018-03-31 NOTE — Telephone Encounter (Signed)
Gabapentin sent to pharmacy for her

## 2018-03-31 NOTE — Progress Notes (Signed)
Sheila [name] , 02/18/1956, 62 y.o., female MRN: [id number] Patient Care Team    Relationship Specialty Notifications Start End  Ma Hillock, DO PCP - General Family Medicine  02/08/17   Ena Dawley, MD Consulting Physician Obstetrics and Gynecology  02/11/17   Irene Shipper, MD Consulting Physician Gastroenterology  02/11/17     Chief Complaint  Patient presents with  . Leg Pain    left, no better from previous visit     Subjective:  Sheila [name] is 62 y.o. female presents for LLE numbness and tingling.  Pt reports her discomfort is still present. She was seen 3/29 and provided with steroids, muscle relaxer and tramadol for pain. She reports she had mild relief initially with the higher dose of steroids and when the tapering began her pain returned to its prior intensity. She has also been taking aleve.  Prior note:  Pt presents for an OV with complaints of left leg pain of 1 week  duration.  Associated symptoms include deep ache feeling from her buttocks to ankle. Started in her lower back last week. She had a rough weekend last weekend with the pain. 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 She denies any weakness of her LE today. She denies any bladder or bowel issues. She has been taking aleve BID and tylenol for pain.  Any NSAIDS besides Aleve tend to cause her severe abd pain, including stronger narcotics as well.    Depression screen [facility] 2/9 03/31/2018 04/08/2017 02/08/2017  Decreased Interest 0 0 0  Down, Depressed, Hopeless 0 0 0  PHQ - 2 Score 0 0 0    Allergies  Allergen Reactions  . Nsaids    Social History   Tobacco Use  . Smoking status: Never Smoker  . Smokeless tobacco: Never Used  Substance Use Topics  . Alcohol use: No   Past Medical History:  Diagnosis Date  . Allergy   . Esophageal stricture   . GERD (gastroesophageal reflux disease)   .  Hiatal hernia   . Hyperlipidemia   . Hypothyroidism    Past Surgical History:  Procedure Laterality Date  . APPENDECTOMY  1967  . BACK SURGERY    . ESOPHAGOGASTRODUODENOSCOPY N/A 02/08/2017   Procedure: ESOPHAGOGASTRODUODENOSCOPY (EGD);  Surgeon: Manus Gunning, MD;  Location: [facility];  Service: Gastroenterology;  Laterality: N/A;  . ESOPHAGOGASTRODUODENOSCOPY (EGD) WITH ESOPHAGEAL DILATION  2004   Family History  Problem Relation Age of Onset  . Diabetes Mother   . Breast cancer Mother   . Hyperlipidemia Mother   . Cancer Father        colon/brain  . Early death Father    Allergies as of 03/31/2018      Reactions   Nsaids       Medication List        Accurate as of 03/31/18  8:53 AM. Always use your most recent med list.          cyclobenzaprine 5 MG tablet Commonly known as:  FLEXERIL Take 1 tablet (5 mg total) by mouth 2 (two) times daily as needed for muscle spasms.   levothyroxine 75 MCG tablet Commonly known as:  SYNTHROID, LEVOTHROID TAKE 75 MCG BY MOUTH EVERY MORNING   pantoprazole 40 MG tablet Commonly known as:  PROTONIX Take 1 tablet (40 mg total) by mouth daily.   traMADol  50 MG tablet Commonly known as:  ULTRAM Take 1 tablet (50 mg total) by mouth every 8 (eight) hours as needed.   Vitamin D (Ergocalciferol) 50000 units Caps capsule Commonly known as:  DRISDOL Take 50,000 Units by mouth every 7 (seven) days.       All past medical history, surgical history, allergies, family history, immunizations andmedications were updated in the EMR today and reviewed under the history and medication portions of their EMR.     ROS: Negative, with the exception of above mentioned in HPI   Objective:  BP (!) 143/82 (BP Location: Right Arm, Patient Position: Sitting, Cuff Size: Normal)   Pulse 93   Temp 98 F (36.7 C)   Resp 20   Ht 5\' 2"  (1.575 m)   Wt 126 lb (57.2 kg)   LMP 02/13/2005   SpO2 100%   BMI 23.05 kg/m  Body mass index is  23.05 kg/m.  Gen: Afebrile. No acute distress.  HENT: AT. [place]. MMM.  Eyes:Pupils Equal Round Reactive to light, Extraocular movements intact,  Conjunctiva without redness, discharge or icterus. Neuro/msk: Normal gait. PERLA. EOMi. Alert. Oriented. Cranial nerves II through XII intact. Muscle strength 5/5 bilateral LE with exception of right flexion 4+5 and left toe lift 4/5. Neg SLR and FABRE with discomfort bilateral.  DTRs equal bilaterally. Psych: Normal affect, dress and demeanor. Normal speech. Normal thought content and judgment..  No exam data present No results found. No results found for this or any previous visit (from the past 24 hour(s)).  Assessment/Plan: Sheila [name] is a 62 y.o. female present for OV for  Lumbar radiculopathy - Prior h/o of foot drop right and now with progressing weakness left. Advised pt moving forward with further work up is recommended given history. Provided pt with options today and she decided to be referred to Ortho. She was a pt of [facility] in the past.  - Urgent referral to ortho. - continue nsaids.  - trial of lyrica. --> if not covered gabapentin start -Tramadol prescribed for severe pain, has a few left.  - F/U PRn as long as ortho taking over.     Reviewed expectations re: course of current medical issues.  Discussed self-management of symptoms.  Outlined signs and symptoms indicating need for more acute intervention.  Patient verbalized understanding and all questions were answered.  Patient received an After-Visit Summary.    No orders of the defined types were placed in this encounter.    Note is dictated utilizing voice recognition software. Although note has been proof read prior to signing, occasional typographical errors still can be missed. If any questions arise, please do not hesitate to call for verification.   electronically signed by:  Howard Pouch, DO  [place]

## 2018-03-31 NOTE — Telephone Encounter (Signed)
Patient returned to office to state she was unable to get script filled for pregabalin (LYRICA) 75 MG capsule.  Per pharmacy, the doctor would need to contact her insurance to get medication approved.    Patient wants to know if she can have a rx sent in for the the other medication that Dr. Raoul Pitch mentioned during her ov this morning.  Pharmacy:  [facility]

## 2018-04-01 DIAGNOSIS — M5417 Radiculopathy, lumbosacral region: Secondary | ICD-10-CM | POA: Diagnosis not present

## 2018-04-01 DIAGNOSIS — M79605 Pain in left leg: Secondary | ICD-10-CM | POA: Diagnosis not present

## 2018-04-17 DIAGNOSIS — M545 Low back pain: Secondary | ICD-10-CM | POA: Diagnosis not present

## 2018-04-23 DIAGNOSIS — M545 Low back pain: Secondary | ICD-10-CM | POA: Diagnosis not present

## 2018-04-23 DIAGNOSIS — M5416 Radiculopathy, lumbar region: Secondary | ICD-10-CM | POA: Diagnosis not present

## 2018-04-23 DIAGNOSIS — M5136 Other intervertebral disc degeneration, lumbar region: Secondary | ICD-10-CM | POA: Diagnosis not present

## 2018-04-28 ENCOUNTER — Encounter: Payer: Self-pay | Admitting: Family Medicine

## 2018-04-28 ENCOUNTER — Ambulatory Visit (INDEPENDENT_AMBULATORY_CARE_PROVIDER_SITE_OTHER): Payer: BLUE CROSS/BLUE SHIELD | Admitting: Family Medicine

## 2018-04-28 VITALS — BP 158/90 | HR 74 | Temp 97.8°F | Resp 20 | Ht 62.0 in | Wt 124.0 lb

## 2018-04-28 DIAGNOSIS — E7849 Other hyperlipidemia: Secondary | ICD-10-CM

## 2018-04-28 DIAGNOSIS — I10 Essential (primary) hypertension: Secondary | ICD-10-CM | POA: Diagnosis not present

## 2018-04-28 DIAGNOSIS — Z5181 Encounter for therapeutic drug level monitoring: Secondary | ICD-10-CM

## 2018-04-28 DIAGNOSIS — Z79899 Other long term (current) drug therapy: Secondary | ICD-10-CM | POA: Diagnosis not present

## 2018-04-28 DIAGNOSIS — E559 Vitamin D deficiency, unspecified: Secondary | ICD-10-CM

## 2018-04-28 DIAGNOSIS — R7303 Prediabetes: Secondary | ICD-10-CM

## 2018-04-28 DIAGNOSIS — Z0000 Encounter for general adult medical examination without abnormal findings: Secondary | ICD-10-CM | POA: Diagnosis not present

## 2018-04-28 DIAGNOSIS — E038 Other specified hypothyroidism: Secondary | ICD-10-CM

## 2018-04-28 LAB — CBC WITH DIFFERENTIAL/PLATELET
BASOS ABS: 0.1 10*3/uL (ref 0.0–0.1)
Basophils Relative: 1.3 % (ref 0.0–3.0)
Eosinophils Absolute: 0.2 10*3/uL (ref 0.0–0.7)
Eosinophils Relative: 3.2 % (ref 0.0–5.0)
HCT: 40.3 % (ref 36.0–46.0)
Hemoglobin: 13.7 g/dL (ref 12.0–15.0)
LYMPHS ABS: 1.7 10*3/uL (ref 0.7–4.0)
Lymphocytes Relative: 25.4 % (ref 12.0–46.0)
MCHC: 33.9 g/dL (ref 30.0–36.0)
MCV: 94.2 fl (ref 78.0–100.0)
MONO ABS: 0.5 10*3/uL (ref 0.1–1.0)
Monocytes Relative: 7.8 % (ref 3.0–12.0)
NEUTROS ABS: 4.1 10*3/uL (ref 1.4–7.7)
NEUTROS PCT: 62.3 % (ref 43.0–77.0)
PLATELETS: 346 10*3/uL (ref 150.0–400.0)
RBC: 4.29 Mil/uL (ref 3.87–5.11)
RDW: 12.8 % (ref 11.5–15.5)
WBC: 6.6 10*3/uL (ref 4.0–10.5)

## 2018-04-28 LAB — COMPREHENSIVE METABOLIC PANEL
ALBUMIN: 4.3 g/dL (ref 3.5–5.2)
ALK PHOS: 73 U/L (ref 39–117)
ALT: 13 U/L (ref 0–35)
AST: 12 U/L (ref 0–37)
BUN: 17 mg/dL (ref 6–23)
CALCIUM: 9.3 mg/dL (ref 8.4–10.5)
CHLORIDE: 104 meq/L (ref 96–112)
CO2: 28 mEq/L (ref 19–32)
Creatinine, Ser: 0.61 mg/dL (ref 0.40–1.20)
GFR: 105.01 mL/min (ref >60.00)
Glucose, Bld: 119 mg/dL — ABNORMAL HIGH (ref 70–99)
POTASSIUM: 4.5 meq/L (ref 3.5–5.1)
Sodium: 138 mEq/L (ref 135–145)
TOTAL PROTEIN: 6.8 g/dL (ref 6.0–8.3)
Total Bilirubin: 0.4 mg/dL (ref 0.2–1.2)

## 2018-04-28 LAB — HEMOGLOBIN A1C: Hgb A1c MFr Bld: 6.6 % — ABNORMAL HIGH (ref 4.6–6.5)

## 2018-04-28 LAB — LIPID PANEL
CHOL/HDL RATIO: 3
Cholesterol: 209 mg/dL — ABNORMAL HIGH (ref 0–200)
HDL: 73.1 mg/dL (ref >39.00)
LDL Cholesterol: 122 mg/dL — ABNORMAL HIGH (ref 0–99)
NONHDL: 136.21
Triglycerides: 71 mg/dL (ref 0.0–149.0)
VLDL: 14.2 mg/dL (ref 0.0–40.0)

## 2018-04-28 LAB — VITAMIN D 25 HYDROXY (VIT D DEFICIENCY, FRACTURES): VITD: 21.43 ng/mL — AB (ref 30.00–100.00)

## 2018-04-28 LAB — VITAMIN B12: Vitamin B-12: 400 pg/mL (ref 211–911)

## 2018-04-28 LAB — MAGNESIUM: Magnesium: 2.2 mg/dL (ref 1.5–2.5)

## 2018-04-28 MED ORDER — LISINOPRIL 5 MG PO TABS: 5.0000 mg | ORAL_TABLET | Freq: Every day | ORAL | 0 refills | Status: DC

## 2018-04-28 NOTE — Patient Instructions (Signed)

## 2018-04-28 NOTE — Progress Notes (Signed)
Patient ID: Sheila [name], female  DOB: Feb 26, 1956, 62 y.o.   MRN: [id number] Patient Care Team    Relationship Specialty Notifications Start End  Ma Hillock, DO PCP - General Family Medicine  02/08/17   Ena Dawley, MD Consulting Physician Obstetrics and Gynecology  02/11/17   Irene Shipper, MD Consulting Physician Gastroenterology  02/11/17     Chief Complaint  Patient presents with  . Annual Exam    Subjective:  Sheila [name] is a 62 y.o.  Female  present for CPE . All past medical history, surgical history, allergies, family history, immunizations, medications and social history were updated in the electronic medical record today. All recent labs, ED visits and hospitalizations within the last year were reviewed.  Health maintenance:  Colonoscopy: scheduled last year and did not have completed. Declines scheduling at this time. Mammogram:  Breast cancer in family. Yearly exams through GYN. Pt reports one in the last year and normal.  Cervical cancer screening: Routine exam through GYN. Immunizations: tdap 2015, Influenza UTD (encouraged yearly), PNA series start at 25,  Shingrix series completed.  Infectious disease screening: HIV and Hep C completed  DEXA: low vit d, estrogen deficient. 03/11/2009 --> osteopenic -1.5  Assistive device: None Oxygen KKX:FGHW Patient has a Dental home. Hospitalizations/ED visits: reviewed  Depression screen [facility] 2/9 04/28/2018 03/31/2018 04/08/2017 02/08/2017  Decreased Interest 0 0 0 0  Down, Depressed, Hopeless 0 0 0 0  PHQ - 2 Score 0 0 0 0   No flowsheet data found.   Current Exercise Habits: The patient does not participate in regular exercise at present Exercise limited by: orthopedic condition(s)   Immunization History  Administered Date(s) Administered  . Zoster Recombinat (Shingrix) 04/08/2017, 06/17/2017     Past Medical History:  Diagnosis Date  . Allergy   . Esophageal stricture   . GERD  (gastroesophageal reflux disease)   . Hiatal hernia   . Hyperlipidemia   . Hypothyroidism    Allergies  Allergen Reactions  . Nsaids    Past Surgical History:  Procedure Laterality Date  . APPENDECTOMY  1967  . BACK SURGERY    . ESOPHAGOGASTRODUODENOSCOPY N/A 02/08/2017   Procedure: ESOPHAGOGASTRODUODENOSCOPY (EGD);  Surgeon: Manus Gunning, MD;  Location: [facility];  Service: Gastroenterology;  Laterality: N/A;  . ESOPHAGOGASTRODUODENOSCOPY (EGD) WITH ESOPHAGEAL DILATION  2004   Family History  Problem Relation Age of Onset  . Diabetes Mother   . Breast cancer Mother   . Hyperlipidemia Mother   . Cancer Father        colon/brain  . Early death Father    Social History   Socioeconomic History  . Marital status: Divorced    Spouse name: Not on file  . Number of children: Not on file  . Years of education: 86  . Highest education level: Not on file  Occupational History  . Occupation: birdery  Social Needs  . Financial resource strain: Not on file  . Food insecurity:    Worry: Not on file    Inability: Not on file  . Transportation needs:    Medical: Not on file    Non-medical: Not on file  Tobacco Use  . Smoking status: Never Smoker  . Smokeless tobacco: Never Used  Substance and Sexual Activity  . Alcohol use: No  . Drug use: No  . Sexual activity: Yes    Partners: Male    Birth control/protection: None  Lifestyle  . Physical activity:  Days per week: Not on file    Minutes per session: Not on file  . Stress: Not on file  Relationships  . Social connections:    Talks on phone: Not on file    Gets together: Not on file    Attends religious service: Not on file    Active member of club or organization: Not on file    Attends meetings of clubs or organizations: Not on file    Relationship status: Not on file  . Intimate partner violence:    Fear of current or ex partner: Not on file    Emotionally abused: Not on file    Physically abused:  Not on file    Forced sexual activity: Not on file  Other Topics Concern  . Not on file  Social History Narrative   Single. 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   Drinks caffeine.   Wears a seatbelt, smoke detector in the home.   Feels safe in her relationships.   Allergies as of 04/28/2018      Reactions   Nsaids       Medication List        Accurate as of 04/28/18  9:27 AM. Always use your most recent med list.          levothyroxine 75 MCG tablet Commonly known as:  SYNTHROID, LEVOTHROID TAKE 75 MCG BY MOUTH EVERY MORNING   pantoprazole 40 MG tablet Commonly known as:  PROTONIX Take 1 tablet (40 mg total) by mouth daily.       All past medical history, surgical history, allergies, family history, immunizations andmedications were updated in the EMR today and reviewed under the history and medication portions of their EMR.     Recent Results (from the past 2160 hour(s))  TSH     Status: None   Collection Time: 02/17/18  9:32 AM  Result Value Ref Range   TSH 3.32 0.35 - 4.50 uIU/mL    No results found.   ROS: 14 pt review of systems performed and negative (unless mentioned in an HPI)  Objective: Pulse 74   Temp 97.8 F (36.6 C)   Resp 20   Ht _0  (1.575 m)   Wt 124 lb (56.2 kg)   LMP 02/13/2005   SpO2 97%   BMI 22.68 kg/m  Gen: Afebrile. No acute distress. Nontoxic in appearance, well-developed, well-nourished,  Pleasant caucasian female.  HENT: AT. [place]. Bilateral TM visualized and normal in appearance, normal external auditory canal. MMM, no oral lesions, adequate dentition. Bilateral nares within normal limits. Throat withoutno erythema, ulcerations or exudates. no Cough on exam, no hoarseness on exam. Eyes:Pupils Equal Round Reactive to light, Extraocular movements intact,  Conjunctiva without redness, discharge or icterus. Neck/lymp/endocrine: Supple,no lymphadenopathy, no thyromegaly CV: RRR no mumru, no edema, +2/4 P posterior tibialis pulses.  no carotid bruits. No JVD. Chest: CTAB, no wheeze, rhonchi or crackles. normal Respiratory effort. good Air movement. Abd: Soft. flat. NTND. BS present. no Masses palpated. No hepatosplenomegaly. No rebound tenderness or guarding. Skin: no rashes, purpura or petechiae. Warm and well-perfused. Skin intact. Neuro/Msk:  Normal gait. PERLA. EOMi. Alert. Oriented x3.  Cranial nerves II through XII intact. Muscle strength 5/5 upper/lower extremity. DTRs equal bilaterally. Psych: Normal affect, dress and demeanor. Normal speech. Normal thought content and judgment.   No exam data present  Assessment/plan: Sheila [name] is a 62 y.o. female present for CPE. Visit for preventive health examination Patient was encouraged to exercise greater  than 150 minutes a week. Patient was encouraged to choose a diet filled with fresh fruits and vegetables, and lean meats. AVS provided to patient today for education/recommendation on gender specific health and safety maintenance. Colonoscopy: scheduled last year and did not have completed. Declines scheduling at this time offered cologuard and FOBT, declined all at this time.  Mammogram:  Breast cancer in family. Yearly exams through GYN. Pt reports one in the last year and normal.  Cervical cancer screening: Routine exam through GYN. Immunizations: tdap 2015, Influenza UTD (encouraged yearly), PNA series start at 85,  Shingrix series completed.  Infectious disease screening: HIV and Hep C completed  DEXA: low vit d, estrogen deficient. 03/11/2009 --> osteopenic -1.5. Other hyperlipidemia Diet and routine exercise.  - Lipid panel Prediabetes - HgB A1c Encounter for long-term current use of medication - CBC w/Diff - Comp Met (CMET) - Magnesium - B12 Vitamin D deficiency - Vitamin D (25 hydroxy) Encounter for monitoring long-term proton pump inhibitor therapy - Vitamin D (25 hydroxy) - Magnesium - B12 Hypertension:  - new onset. BP borderline last two  visits and now elevated x2 today.  - Discussed BP medication need. Pt is agreeable to start lisinopril 5 mg QD.  - Low sodium. Routine exercise is difficult right now with back injury.  - F/U 1 week for nurse visit then every 6 months once BP stable.    Return in about 1 year (around 04/29/2019) for CPE.  Electronically signed by: Howard Pouch, DO [place]

## 2018-04-29 ENCOUNTER — Telehealth: Payer: Self-pay | Admitting: Family Medicine

## 2018-04-29 MED ORDER — VITAMIN D (CHOLECALCIFEROL) 25 MCG (1000 UT) PO TABS: 1.0000 | ORAL_TABLET | Freq: Every day | ORAL | 3 refills | Status: DC

## 2018-04-29 NOTE — Telephone Encounter (Signed)
Please inform patient the following information:  Her labs look good with the following exceptions her Vit d is low again. Prescribed a daily dose of 1000 u daily. If insurance does not cover OTC is fine, but she needs to take everyday with a meal indefinitely for bone health.   Her diabetes screening is also higher 6.1--> 6.6. Fasting glucose 119. This is technically diabetes by a1c alone, however fasting glucose is still prediabetes range. We can either start a medication now and she work on increasing her exercise and modifying her diet  OR she can increase her exercise and start a mediterranean diet alone.  - Either choice ,  followup in 3 months provider appt to A1C retested and vit d--> if remains elevated at that time will either start (or increase if she starts now) medication.   - we could also refer her to nutrition at anytime to discuss dietary changes if she would like.

## 2018-04-29 NOTE — Telephone Encounter (Signed)
Spoke with patient reviewed lab results and instructions. Patient verbalized understanding. Patient will try diet and exercise. Scheduled patient 3 month follow up.

## 2018-04-30 DIAGNOSIS — M5416 Radiculopathy, lumbar region: Secondary | ICD-10-CM | POA: Diagnosis not present

## 2018-05-03 DIAGNOSIS — M5416 Radiculopathy, lumbar region: Secondary | ICD-10-CM | POA: Diagnosis not present

## 2018-05-05 ENCOUNTER — Encounter: Payer: Self-pay | Admitting: *Deleted

## 2018-05-05 ENCOUNTER — Ambulatory Visit (INDEPENDENT_AMBULATORY_CARE_PROVIDER_SITE_OTHER): Payer: BLUE CROSS/BLUE SHIELD | Admitting: *Deleted

## 2018-05-05 VITALS — BP 125/70 | HR 89 | Temp 98.2°F | Resp 16

## 2018-05-05 DIAGNOSIS — I10 Essential (primary) hypertension: Secondary | ICD-10-CM | POA: Diagnosis not present

## 2018-05-05 MED ORDER — LISINOPRIL 5 MG PO TABS: 5.0000 mg | ORAL_TABLET | Freq: Every day | ORAL | 1 refills | Status: DC

## 2018-05-05 NOTE — Progress Notes (Addendum)
ARKIE TAGLIAFERRO is a 62 y.o. female presents to the office today for Blood pressure recheck secondary to elevated BP in office of 158/90.   Blood pressure medication: lisinopril 5mg  daily If on medication, Last dose was at least 1-2 hours prior to recheck: Yes, she took last dose at lunch time yesterday (05/04/18). Blood pressure was taken in the left arm after patient rested for 5 minutes.  LMP 02/13/2005    BP 125/70 (BP Location: Left Arm, Patient Position: Sitting, Cuff Size: Normal)   Pulse 89   Temp 98.2 F (36.8 C) (Oral)   Resp 16   LMP 02/13/2005   SpO2 99%    [place]  Please inform patient the following information: BP looks great after start of lisinopril. Continue lisinopril 5 mg QD. Refills provided, but told pharmacy to hold until she requested. F/u 6 months.   Electronically Signed by: Howard Pouch, DO [place] [facility] [place]

## 2018-05-05 NOTE — Addendum Note (Signed)
Addended by: Howard Pouch A on: 05/05/2018 05:14 PM   Modules accepted: Orders

## 2018-05-06 DIAGNOSIS — M5416 Radiculopathy, lumbar region: Secondary | ICD-10-CM | POA: Diagnosis not present

## 2018-05-06 NOTE — Progress Notes (Addendum)
Spoke with patient reviewed information and instructions. Patient verbalized understanding. 

## 2018-05-10 DIAGNOSIS — M5416 Radiculopathy, lumbar region: Secondary | ICD-10-CM | POA: Diagnosis not present

## 2018-05-17 DIAGNOSIS — M5416 Radiculopathy, lumbar region: Secondary | ICD-10-CM | POA: Diagnosis not present

## 2018-05-19 ENCOUNTER — Telehealth: Payer: Self-pay | Admitting: Family Medicine

## 2018-05-19 NOTE — Telephone Encounter (Signed)
Spoke with patient schedule her to been seen for evaluation.

## 2018-05-19 NOTE — Telephone Encounter (Signed)
Copied from [place] [id number]. Topic: Quick Communication - See Telephone Encounter >> May 19, 2018  1:17 PM Rutherford Nail, NT wrote: CRM for notification. See Telephone encounter for: 05/19/18. Patient states that 3 weeks ago she got put on a lisinopril (PRINIVIL,ZESTRIL) 5 MG tablet. Patient states that she doesn't feel good, has no energy, and gets a headache just about every day. Would like a call to discuss other options. CB#: [phone]

## 2018-05-20 ENCOUNTER — Ambulatory Visit: Payer: BLUE CROSS/BLUE SHIELD | Admitting: Family Medicine

## 2018-05-20 ENCOUNTER — Encounter: Payer: Self-pay | Admitting: Family Medicine

## 2018-05-20 VITALS — BP 151/82 | HR 80 | Temp 97.9°F | Resp 20 | Ht 62.0 in | Wt 121.5 lb

## 2018-05-20 DIAGNOSIS — I10 Essential (primary) hypertension: Secondary | ICD-10-CM | POA: Diagnosis not present

## 2018-05-20 DIAGNOSIS — G4452 New daily persistent headache (NDPH): Secondary | ICD-10-CM | POA: Diagnosis not present

## 2018-05-20 DIAGNOSIS — M5416 Radiculopathy, lumbar region: Secondary | ICD-10-CM | POA: Diagnosis not present

## 2018-05-20 LAB — BASIC METABOLIC PANEL
BUN: 16 mg/dL (ref 6–23)
CHLORIDE: 105 meq/L (ref 96–112)
CO2: 29 mEq/L (ref 19–32)
Calcium: 9.3 mg/dL (ref 8.4–10.5)
Creatinine, Ser: 0.61 mg/dL (ref 0.40–1.20)
GFR: 104.99 mL/min (ref >60.00)
GLUCOSE: 106 mg/dL — AB (ref 70–99)
POTASSIUM: 4.2 meq/L (ref 3.5–5.1)
Sodium: 139 mEq/L (ref 135–145)

## 2018-05-20 NOTE — Patient Instructions (Signed)
1. Could be the decrease in caffeine and/or sugar. --> try drinking a soda (just to test).  2. We will test labs to make sure not electrolyte disorder.  3. We could change the medication if needed. So, if electrolytes are  normal and you drinking a soda and avoiding tylenol/nsiads doe snot help we will do that.  4. Try taking BP med in the morning sometime.     Analgesic Rebound Headache An analgesic rebound headache, sometimes called a medication overuse headache, is a headache that comes after pain medicine (analgesic) taken to treat the original (primary) headache has worn off. Any type of primary headache can return as a rebound headache if a person regularly takes analgesics more than three times a week to treat it. The types of primary headaches that are commonly associated with rebound headaches include:  Migraines.  Headaches that arise from tense muscles in the head and neck area (tension headaches).  Headaches that develop and happen again (recur) on one side of the head and around the eye (cluster headaches).  If rebound headaches continue, they become chronic daily headaches. What are the causes? This condition may be caused by frequent use of:  Over-the-counter medicines such as aspirin, ibuprofen, and acetaminophen.  Sinus relief medicines and other medicines that contain caffeine.  Narcotic pain medicines such as codeine and oxycodone.  What are the signs or symptoms? The symptoms of a rebound headache are the same as the symptoms of the original headache. Some of the symptoms of specific types of headaches include: Migraine headache  Pulsing or throbbing pain on one or both sides of the head.  Severe pain that interferes with daily activities.  Pain that is worsened by physical activity.  Nausea, vomiting, or both.  Pain with exposure to bright light, loud noises, or strong smells.  General sensitivity to bright light, loud noises, or strong smells.  Visual  changes.  Numbness of one or both arms. Tension headache  Pressure around the head.  Dull, aching head pain.  Pain felt over the front and sides of the head.  Tenderness in the muscles of the head, neck, and shoulders. Cluster headache  Severe pain that begins in or around one eye or temple.  Redness and tearing in the eye on the same side as the pain.  Droopy or swollen eyelid.  One-sided head pain.  Nausea.  Runny nose.  Sweaty, pale facial skin.  Restlessness. How is this diagnosed? This condition is diagnosed by:  Reviewing your medical history. This includes the nature of your primary headaches.  Reviewing the types of pain medicines that you have been using to treat your headaches and how often you take them.  How is this treated? This condition may be treated or managed by:  Discontinuing frequent use of the analgesic medicine. Doing this may worsen your headaches at first, but the pain should eventually become more manageable, less frequent, and less severe.  Seeing a headache specialist. He or she may be able to help you manage your headaches and help make sure there is not another cause of the headaches.  Using methods of stress relief, such as acupuncture, counseling, biofeedback, and massage. Talk with your health care provider about which methods might be good for you.  Follow these instructions at home:  Take over-the-counter and prescription medicines only as told by your health care provider.  Stop the repeated use of pain medicine as told by your health care provider. Stopping can be difficult. Carefully  follow instructions from your health care provider.  Avoid triggers that are known to cause your primary headaches.  Keep all follow-up visits as told by your health care provider. This is important. Contact a health care provider if:  You continue to experience headaches after following treatments that your health care provider  recommended. Get help right away if:  You develop new headache pain.  You develop headache pain that is different than what you have experienced in the past.  You develop numbness or tingling in your arms or legs.  You develop changes in your speech or vision. This information is not intended to replace advice given to you by your health care provider. Make sure you discuss any questions you have with your health care provider. Document Released: 02/23/2004 Document Revised: 06/22/2016 Document Reviewed: 05/07/2016 Elsevier Interactive Patient Education  [employer].

## 2018-05-20 NOTE — Progress Notes (Signed)
Sheila [name] , 1956-04-20, 62 y.o., female MRN: [id number] Patient Care Team    Relationship Specialty Notifications Start End  Ma Hillock, DO PCP - General Family Medicine  02/08/17   Ena Dawley, MD Consulting Physician Obstetrics and Gynecology  02/11/17   Irene Shipper, MD Consulting Physician Gastroenterology  02/11/17     Chief Complaint  Patient presents with  . Hypertension    fatigue,headaches not feeling well since starting Lisinopril     Subjective:   Hypertension:  Pt reports compliance with lisinopril 5 mg Qd, she takes at lunch. She has not had her med yet today.  She was started on this medication a couple weeks ago after continued elevated BP readings. She had a nurse follow up with normal BP readings 1 week after starting medication. She presents today with complaints of fatigue, headache and not feeling well since starting medication. Blood pressures ranges at home are not checked. Patient denies chest pain, shortness of breath or lower extremity edema. Pt does not take  daily baby ASA. Pt is not  prescribed statin. BMP: 04/28/2018 WNL CBC: 04/28/2018 WNL Lipids: 04/28/2018 tch 209, HDL 73, LDL 122, tg 71 Diet: no routine diet and trying to cut as sugar.  Exercise: nothing routine.  RF: HTN, HLD   Depression screen [facility] 2/9 04/28/2018 03/31/2018 04/08/2017 02/08/2017  Decreased Interest 0 0 0 0  Down, Depressed, Hopeless 0 0 0 0  PHQ - 2 Score 0 0 0 0    Allergies  Allergen Reactions  . Nsaids    Social History   Tobacco Use  . Smoking status: Never Smoker  . Smokeless tobacco: Never Used  Substance Use Topics  . Alcohol use: No   Past Medical History:  Diagnosis Date  . Allergy   . Esophageal stricture   . GERD (gastroesophageal reflux disease)   . Hiatal hernia   . Hyperlipidemia   . Hypothyroidism    Past Surgical History:  Procedure Laterality Date  . APPENDECTOMY  1967  . BACK SURGERY    . ESOPHAGOGASTRODUODENOSCOPY N/A  02/08/2017   Procedure: ESOPHAGOGASTRODUODENOSCOPY (EGD);  Surgeon: Manus Gunning, MD;  Location: [facility];  Service: Gastroenterology;  Laterality: N/A;  . ESOPHAGOGASTRODUODENOSCOPY (EGD) WITH ESOPHAGEAL DILATION  2004   Family History  Problem Relation Age of Onset  . Diabetes Mother   . Breast cancer Mother   . Hyperlipidemia Mother   . Cancer Father        colon/brain  . Early death Father    Allergies as of 05/20/2018      Reactions   Nsaids       Medication List        Accurate as of 05/20/18  9:39 AM. Always use your most recent med list.          levothyroxine 75 MCG tablet Commonly known as:  SYNTHROID, LEVOTHROID TAKE 75 MCG BY MOUTH EVERY MORNING   lisinopril 5 MG tablet Commonly known as:  PRINIVIL,ZESTRIL Take 1 tablet (5 mg total) by mouth daily.   pantoprazole 40 MG tablet Commonly known as:  PROTONIX Take 1 tablet (40 mg total) by mouth daily.   Vitamin D (Cholecalciferol) 1000 units Tabs Take 1 tablet by mouth daily with breakfast.       All past medical history, surgical history, allergies, family history, immunizations andmedications were updated in the EMR today and reviewed under the history and medication portions of their EMR.     ROS: Negative,  with the exception of above mentioned in HPI   Objective:  BP (!) 151/82 (BP Location: Right Arm, Patient Position: Sitting, Cuff Size: Normal)   Pulse 80   Temp 97.9 F (36.6 C)   Resp 20   Ht 5\' 2"  (1.575 m)   Wt 121 lb 8 oz (55.1 kg)   LMP 02/13/2005   SpO2 99%   BMI 22.22 kg/m  Body mass index is 22.22 kg/m. Gen: Afebrile. No acute distress. Nontoxic in appearance, well developed, well nourished.  HENT: AT. [place].MMM. TM normal bilaterally. No erythema, drainage or swelling of nostrils. No cough or hoarseness.  Eyes:Pupils Equal Round Reactive to light, Extraocular movements intact,  Conjunctiva without redness, discharge or icterus. CV: RRR no murmur, no edema Chest: CTAB, no  wheeze or crackles.  Abd: Soft. NTND. BS present. no Masses palpated.  Skin: no rashes, purpura or petechiae.  Neuro:  Normal gait. PERLA. EOMi. Alert. Oriented x3  Psych: Normal affect, dress and demeanor. Normal speech. Normal thought content and judgment.  No exam data present No results found. No results found for this or any previous visit (from the past 24 hour(s)).  Assessment/Plan: [name] is a 62 y.o. female present for OV for  Essential hypertension/New daily persistent headache - DDX discussed: rebound headache, caffeine/sugar decrease, electrolyte imbalance or BP med.  - No signs of infection today on exam.  - avoid nsaids if able/avs on rebound headache.  - trial of caffeine/sugar to see if relieves symptoms. --> she was dx w/ prediabetes and has been avoiding all sugar for last 3 weeks.  - Basic Metabolic Panel (BMET) - if labs normal or elevated potassium, will consider changing med to amlodipine 2.5 mg QD with BP recheck in a week. Suspect her headache and decrease energy is from caffeine/sugar withdrawal.  - F/U depending on labs  Reviewed expectations re: course of current medical issues.  Discussed self-management of symptoms.  Outlined signs and symptoms indicating need for more acute intervention.  Patient verbalized understanding and all questions were answered.  Patient received an After-Visit Summary.    No orders of the defined types were placed in this encounter.    Note is dictated utilizing voice recognition software. Although note has been proof read prior to signing, occasional typographical errors still can be missed. If any questions arise, please do not hesitate to call for verification.   electronically signed by:  Howard Pouch, DO  [place]

## 2018-05-21 ENCOUNTER — Telehealth: Payer: Self-pay | Admitting: Family Medicine

## 2018-05-21 NOTE — Telephone Encounter (Signed)
Patient aware.

## 2018-05-21 NOTE — Telephone Encounter (Signed)
Spoke with patient reviewed lab results. Patient states she did drink caffeine and she feels much better today. She took her lisinopril this am and has felt great today.

## 2018-05-21 NOTE — Telephone Encounter (Signed)
Good. It appears she should have a 90 day supply with additional refill at her pharmacy.  F/U 6 mos

## 2018-05-21 NOTE — Telephone Encounter (Signed)
Please inform patient the following information: Her labs are normal.  -Please ask her if she tried drinking caffeine yesterday as instructed?  And if so, it did not relieve her fatigue and headache.  If it did, then I would want to leave her lisinopril 5 mg tablets daily as is  Since her blood pressure responded well. -If she did not try drinking caffeine, then I would like her to try that for a few days before changing blood pressure medication. -If She did try the caffeine and it did not work, then I will change her medication to amlodipine 2.5 mg daily and she is to follow-up in 1-2 week with blood pressure/provider appointment for recheck and tapering on new medication

## 2018-05-21 NOTE — Telephone Encounter (Signed)
Tried to contact patient voice mail not set up unable to leave message. 

## 2018-05-26 DIAGNOSIS — M5416 Radiculopathy, lumbar region: Secondary | ICD-10-CM | POA: Diagnosis not present

## 2018-05-27 DIAGNOSIS — M5136 Other intervertebral disc degeneration, lumbar region: Secondary | ICD-10-CM | POA: Diagnosis not present

## 2018-05-27 DIAGNOSIS — M545 Low back pain: Secondary | ICD-10-CM | POA: Diagnosis not present

## 2018-06-07 DIAGNOSIS — M5416 Radiculopathy, lumbar region: Secondary | ICD-10-CM | POA: Diagnosis not present

## 2018-07-01 DIAGNOSIS — D1801 Hemangioma of skin and subcutaneous tissue: Secondary | ICD-10-CM | POA: Diagnosis not present

## 2018-07-01 DIAGNOSIS — L218 Other seborrheic dermatitis: Secondary | ICD-10-CM | POA: Diagnosis not present

## 2018-07-01 DIAGNOSIS — L821 Other seborrheic keratosis: Secondary | ICD-10-CM | POA: Diagnosis not present

## 2018-07-01 DIAGNOSIS — L570 Actinic keratosis: Secondary | ICD-10-CM | POA: Diagnosis not present

## 2018-08-13 ENCOUNTER — Encounter: Payer: Self-pay | Admitting: Family Medicine

## 2018-08-13 ENCOUNTER — Ambulatory Visit: Payer: BLUE CROSS/BLUE SHIELD | Admitting: Family Medicine

## 2018-08-13 VITALS — BP 140/78 | HR 72 | Temp 97.8°F | Resp 18 | Ht 62.0 in | Wt 118.0 lb

## 2018-08-13 DIAGNOSIS — I10 Essential (primary) hypertension: Secondary | ICD-10-CM

## 2018-08-13 DIAGNOSIS — R7303 Prediabetes: Secondary | ICD-10-CM

## 2018-08-13 DIAGNOSIS — Z23 Encounter for immunization: Secondary | ICD-10-CM | POA: Diagnosis not present

## 2018-08-13 LAB — POCT GLYCOSYLATED HEMOGLOBIN (HGB A1C): HEMOGLOBIN A1C: 5.4 % (ref 4.0–5.6)

## 2018-08-13 MED ORDER — LISINOPRIL 5 MG PO TABS: 5.0000 mg | ORAL_TABLET | Freq: Every day | ORAL | 1 refills | Status: DC

## 2018-08-13 NOTE — Progress Notes (Signed)
Sheila [name] , [birth year], 62 y.o., female MRN: [id number] Patient Care Team    Relationship Specialty Notifications Start End  Ma Hillock, DO PCP - General Family Medicine  02/08/17   Ena Dawley, MD Consulting Physician Obstetrics and Gynecology  02/11/17   Irene Shipper, MD Consulting Physician Gastroenterology  02/11/17     Chief Complaint  Patient presents with  . Follow-up    prediabetes,Vit D     Subjective:   Hypertension:  Pt reports compliance with lisinopril 5 mg Qd. Patient denies chest pain, shortness of breath, dizziness or lower extremity edema. She has not taken her BP med yet today  BMP: 04/28/2018 WNL CBC: 04/28/2018 WNL Lipids: 04/28/2018 tch 209, HDL 73, LDL 122, tg 71 Diet: no routine diet and trying to cut as sugar.  Exercise: nothing routine.  RF: HTN, HLD  Elevated a1c:  Denies numbness, tingling of extremities, hypo/hyperglycemic events or non-healing wounds.  Flu shot: completed today (recommneded yearly) Walking more and decreased sugar.  A1c: 6.1--> 6.6 (04/28/2018)--> 5.4 today.     Depression screen [facility] 2/9 04/28/2018 03/31/2018 04/08/2017 02/08/2017  Decreased Interest 0 0 0 0  Down, Depressed, Hopeless 0 0 0 0  PHQ - 2 Score 0 0 0 0    Allergies  Allergen Reactions  . Nsaids    Social History   Tobacco Use  . Smoking status: Never Smoker  . Smokeless tobacco: Never Used  Substance Use Topics  . Alcohol use: No   Past Medical History:  Diagnosis Date  . Allergy   . Esophageal stricture   . GERD (gastroesophageal reflux disease)   . Hiatal hernia   . Hyperlipidemia   . Hypothyroidism    Past Surgical History:  Procedure Laterality Date  . APPENDECTOMY  1967  . BACK SURGERY    . ESOPHAGOGASTRODUODENOSCOPY N/A 02/08/2017   Procedure: ESOPHAGOGASTRODUODENOSCOPY (EGD);  Surgeon: Manus Gunning, MD;  Location: [facility];  Service: Gastroenterology;  Laterality: N/A;  . ESOPHAGOGASTRODUODENOSCOPY (EGD) WITH  ESOPHAGEAL DILATION  2004   Family History  Problem Relation Age of Onset  . Diabetes Mother   . Breast cancer Mother   . Hyperlipidemia Mother   . Cancer Father        colon/brain  . Early death Father    Allergies as of 08/13/2018      Reactions   Nsaids       Medication List        Accurate as of 08/13/18  9:01 AM. Always use your most recent med list.          levothyroxine 75 MCG tablet Commonly known as:  SYNTHROID, LEVOTHROID TAKE 75 MCG BY MOUTH EVERY MORNING   lisinopril 5 MG tablet Commonly known as:  PRINIVIL,ZESTRIL Take 1 tablet (5 mg total) by mouth daily.   pantoprazole 40 MG tablet Commonly known as:  PROTONIX Take 1 tablet (40 mg total) by mouth daily.   Vitamin D (Cholecalciferol) 1000 units Tabs Take 1 tablet by mouth daily with breakfast.       All past medical history, surgical history, allergies, family history, immunizations andmedications were updated in the EMR today and reviewed under the history and medication portions of their EMR.     ROS: Negative, with the exception of above mentioned in HPI   Objective:  BP 140/78 (BP Location: Left Arm, Patient Position: Sitting, Cuff Size: Normal)   Pulse 72   Temp 97.8 F (36.6 C)   Resp 18  Ht 5\' 2"  (1.575 m)   Wt 118 lb (53.5 kg)   LMP 02/13/2005   SpO2 99%   BMI 21.58 kg/m  Body mass index is 21.58 kg/m. Gen: Afebrile. No acute distress. Nontoxic in appearance.  HENT: AT. [place].MMM.  Eyes:Pupils Equal Round Reactive to light, Extraocular movements intact,  Conjunctiva without redness, discharge or icterus. CV: RRR no murmur, no edema, +2/4 P posterior tibialis pulses Chest: CTAB, no wheeze or crackles Abd: Soft. NTND. BS present. no Masses palpated.  Skin: no rashes, purpura or petechiae.  Neuro:  Normal gait. PERLA. EOMi. Alert. Oriented x3   No exam data present No results found. Results for orders placed or performed in visit on 08/13/18 (from the past 24 hour(s))  POCT  glycosylated hemoglobin (Hb A1C)     Status: Normal   Collection Time: 08/13/18  8:56 AM  Result Value Ref Range   Hemoglobin A1C 5.4 4.0 - 5.6 %   HbA1c POC (<> result, manual entry)     HbA1c, POC (prediabetic range)     HbA1c, POC (controlled diabetic range)      Assessment/Plan: Sheila [name] is a 62 y.o. female present for OV for  Essential hypertension - borderline BP today. She has not taken her med yet. She reports seeing 185U systolic when tested at work. She will check it today and next few days. If routinley in the 130's, she will call back and we will increase her lisinopril to 10 mg QD (over the phone) and follow up 2 weeks after change. Otherwise if BP normal, will f/u every 6 months as prior  Prediabetes - she made dietary changes and a1c today is now normal 6.6--> 5.4. Great job.  - POCT glycosylated hemoglobin (Hb A1C) Will monitor yearly with cpe  Immunization due - Flu Vaccine QUAD 6+ mos PF IM (Fluarix Quad PF)    Reviewed expectations re: course of current medical issues.  Discussed self-management of symptoms.  Outlined signs and symptoms indicating need for more acute intervention.  Patient verbalized understanding and all questions were answered.  Patient received an After-Visit Summary.    Orders Placed This Encounter  Procedures  . POCT glycosylated hemoglobin (Hb A1C)     Note is dictated utilizing voice recognition software. Although note has been proof read prior to signing, occasional typographical errors still can be missed. If any questions arise, please do not hesitate to call for verification.   electronically signed by:  Howard Pouch, DO  [place]

## 2018-08-13 NOTE — Patient Instructions (Addendum)
Test your BP at least 1 hour after medication and if in 130 or above routinely, call in and we will increase your medication and then follow up closer with you for recheck.  Good job on getting your flu shot   Please help [place] help you:  We are honored you have chosen [facility] for your Primary Care home. Below you will find basic instructions that you may need to access in the future. Please help [place] help you by reading the instructions, which cover many of the frequent questions we experience.   Prescription refills and request:  -In order to allow more efficient response time, please call your pharmacy for all refills. They will forward the request electronically to [place]. This allows for the quickest possible response. Request left on a nurse line can take longer to refill, since these are checked as time allows between office patients and other phone calls.  - refill request can take up to 3-5 working days to complete.  - If request is sent electronically and request is appropiate, it is usually completed in 1-2 business days.  - all patients will need to be seen routinely for all chronic medical conditions requiring prescription medications (see follow-up below). If you are overdue for follow up on your condition, you will be asked to make an appointment and we will call in enough medication to cover you until your appointment (up to 30 days).  - all controlled substances will require a face to face visit to request/refill.  - if you desire your prescriptions to go through a new pharmacy, and have an active script at original pharmacy, you will need to call your pharmacy and have scripts transferred to new pharmacy. This is completed between the pharmacy locations and not by your provider.    Results: If any images or labs were ordered, it can take up to 1 week to get results depending on the test ordered and the lab/facility running and resulting the test. - Normal or stable results, which  do not need further discussion, may be released to your mychart immediately with attached note to you. A call may not be generated for normal results. Please make certain to sign up for mychart. If you have questions on how to activate your mychart you can call the front office.  - If your results need further discussion, our office will attempt to contact you via phone, and if unable to reach you after 2 attempts, we will release your abnormal result to your mychart with instructions.  - All results will be automatically released in mychart after 1 week.  - Your provider will provide you with explanation and instruction on all relevant material in your results. Please keep in mind, results and labs may appear confusing or abnormal to the untrained eye, but it does not mean they are actually abnormal for you personally. If you have any questions about your results that are not covered, or you desire more detailed explanation than what was provided, you should make an appointment with your provider to do so.   Our office handles many outgoing and incoming calls daily. If we have not contacted you within 1 week about your results, please check your mychart to see if there is a message first and if not, then contact our office.  In helping with this matter, you help decrease call volume, and therefore allow [place] to be able to respond to patients needs more efficiently.   Acute office visits (sick  visit):  An acute visit is intended for a new problem and are scheduled in shorter time slots to allow schedule openings for patients with new problems. This is the appropriate visit to discuss a new problem. Problems will not be addressed by phone call or Echart message. Appointment is needed if requesting treatment. In order to provide you with excellent quality medical care with proper time for you to explain your problem, have an exam and receive treatment with instructions, these appointments should be limited to  one new problem per visit. If you experience a new problem, in which you desire to be addressed, please make an acute office visit, we save openings on the schedule to accommodate you. Please do not save your new problem for any other type of visit, let us take care of it properly and quickly for you.   Follow up visits:  Depending on your condition(s) your provider will need to see you routinely in order to provide you with quality care and prescribe medication(s). Most chronic conditions (Example: hypertension, Diabetes, depression/anxiety... etc), require visits a couple times a year. Your provider will instruct you on proper follow up for your personal medical conditions and history. Please make certain to make follow up appointments for your condition as instructed. Failing to do so could result in lapse in your medication treatment/refills. If you request a refill, and are overdue to be seen on a condition, we will always provide you with a 30 day script (once) to allow you time to schedule.    Medicare wellness (well visit): - we have a wonderful Nurse Maudie Mercury), that will meet with you and provide you will yearly medicare wellness visits. These visits should occur yearly (can not be scheduled less than 1 calendar year apart) and cover preventive health, immunizations, advance directives and screenings you are entitled to yearly through your medicare benefits. Do not miss out on your entitled benefits, this is when medicare will pay for these benefits to be ordered for you.  These are strongly encouraged by your provider and is the appropriate type of visit to make certain you are up to date with all preventive health benefits. If you have not had your medicare wellness exam in the last 12 months, please make certain to schedule one by calling the office and schedule your medicare wellness with Maudie Mercury as soon as possible.   Yearly physical (well visit):  - Adults are recommended to be seen yearly for  physicals. Check with your insurance and date of your last physical, most insurances require one calendar year between physicals. Physicals include all preventive health topics, screenings, medical exam and labs that are appropriate for gender/age and history. You may have fasting labs needed at this visit. This is a well visit (not a sick visit), new problems should not be covered during this visit (see acute visit).  - Pediatric patients are seen more frequently when they are younger. Your provider will advise you on well child visit timing that is appropriate for your their age. - This is not a medicare wellness visit. Medicare wellness exams do not have an exam portion to the visit. Some medicare companies allow for a physical, some do not allow a yearly physical. If your medicare allows a yearly physical you can schedule the medicare wellness with our nurse Maudie Mercury and have your physical with your provider after, on the same day. Please check with insurance for your full benefits.   Late Policy/No Shows:  - all new patients  should arrive 15-30 minutes earlier than appointment to allow [place] time  to  obtain all personal demographics,  insurance information and for you to complete office paperwork. - All established patients should arrive 10-15 minutes earlier than appointment time to update all information and be checked in .  - In our best efforts to run on time, if you are late for your appointment you will be asked to either reschedule or if able, we will work you back into the schedule. There will be a wait time to work you back in the schedule,  depending on availability.  - If you are unable to make it to your appointment as scheduled, please call 24 hours ahead of time to allow [place] to fill the time slot with someone else who needs to be seen. If you do not cancel your appointment ahead of time, you may be charged a no show fee.

## 2018-08-20 ENCOUNTER — Telehealth: Payer: Self-pay | Admitting: Family Medicine

## 2018-08-20 MED ORDER — LISINOPRIL 10 MG PO TABS: 10.0000 mg | ORAL_TABLET | Freq: Every day | ORAL | 1 refills | Status: DC

## 2018-08-20 NOTE — Telephone Encounter (Signed)
Increase lisinopril to 10 mg QD. She can take two of her 5 mg tabs (same time). New script called in is 10 mg per tab.--> take one then.  F/U 1-2 weeks for nurse visit for BP recheck--> schedule please.

## 2018-08-20 NOTE — Telephone Encounter (Signed)
Copied from [place] [id number]. Topic: Quick Communication - See Telephone Encounter >> Aug 20, 2018  3:42 PM Sheran Luz wrote: CRM for notification. See Telephone encounter for: 08/20/18.  Pt states that Dr Raoul Pitch asked her to report any changes in her bp. Pt states that this morning 9/4 her bp was 145/84. Pt also stated that she has a slight headache. Please advise.

## 2018-08-21 NOTE — Telephone Encounter (Signed)
Tried to contact patient voice mail not set up unable to leave message.

## 2018-08-22 NOTE — Telephone Encounter (Signed)
Spoke with patient reviewed  instructions. Patient verbalized understanding. Scheduled patient for nurse visit to check BP.

## 2018-09-02 ENCOUNTER — Ambulatory Visit: Payer: BLUE CROSS/BLUE SHIELD

## 2018-09-05 ENCOUNTER — Ambulatory Visit: Payer: BLUE CROSS/BLUE SHIELD

## 2018-09-16 ENCOUNTER — Ambulatory Visit: Payer: BLUE CROSS/BLUE SHIELD | Admitting: Family Medicine

## 2018-09-16 VITALS — BP 128/70 | HR 68

## 2018-09-16 DIAGNOSIS — I10 Essential (primary) hypertension: Secondary | ICD-10-CM

## 2018-09-16 NOTE — Progress Notes (Addendum)
Sheila [name] is a 62 y.o. female presents to the office today for Blood pressure recheck secondary to increase lisinopril  Blood pressure medication: lisinopril 10mg  daily.  If on medication, Last dose was at least 1-2 hours prior to recheck: No Blood pressure was taken in the right arm after patient rested for 5 minutes.  BP 128/70 pulse 68  Goerge Mohr, Burnett Kanaris   Please inform patient the following information: BP in range. Continue lisinopril 10 mg QD. Follow up in 6 mos.   Electronically Signed by: Howard Pouch, DO [facility]

## 2018-09-17 NOTE — Progress Notes (Signed)
Tried to contact patient without success.  Unable to reach, no VM set up.

## 2018-09-18 NOTE — Progress Notes (Signed)
Patient advised.  Patient states she will have to CB to schedule.

## 2018-11-17 DIAGNOSIS — Z124 Encounter for screening for malignant neoplasm of cervix: Secondary | ICD-10-CM | POA: Diagnosis not present

## 2018-11-17 DIAGNOSIS — Z1231 Encounter for screening mammogram for malignant neoplasm of breast: Secondary | ICD-10-CM | POA: Diagnosis not present

## 2018-11-17 DIAGNOSIS — Z6822 Body mass index (BMI) 22.0-22.9, adult: Secondary | ICD-10-CM | POA: Diagnosis not present

## 2018-11-17 DIAGNOSIS — Z01411 Encounter for gynecological examination (general) (routine) with abnormal findings: Secondary | ICD-10-CM | POA: Diagnosis not present

## 2018-11-21 ENCOUNTER — Other Ambulatory Visit: Payer: Self-pay | Admitting: Family Medicine

## 2019-02-21 ENCOUNTER — Other Ambulatory Visit: Payer: Self-pay | Admitting: Family Medicine

## 2019-02-23 NOTE — Telephone Encounter (Signed)
Pt is due for f/u [facility], will send Rx for #90 w/ 0RF. Needs f/u before this runs out.   Pt advised and voiced understanding.    Apt made for 03/10/19 at 9:30am.

## 2019-03-10 ENCOUNTER — Ambulatory Visit: Payer: BLUE CROSS/BLUE SHIELD | Admitting: Family Medicine

## 2019-03-31 ENCOUNTER — Ambulatory Visit (INDEPENDENT_AMBULATORY_CARE_PROVIDER_SITE_OTHER): Payer: BLUE CROSS/BLUE SHIELD | Admitting: Family Medicine

## 2019-03-31 ENCOUNTER — Encounter: Payer: Self-pay | Admitting: Family Medicine

## 2019-03-31 ENCOUNTER — Other Ambulatory Visit: Payer: Self-pay

## 2019-03-31 VITALS — BP 148/78 | Ht 62.0 in

## 2019-03-31 DIAGNOSIS — E038 Other specified hypothyroidism: Secondary | ICD-10-CM | POA: Diagnosis not present

## 2019-03-31 DIAGNOSIS — E7849 Other hyperlipidemia: Secondary | ICD-10-CM

## 2019-03-31 DIAGNOSIS — I10 Essential (primary) hypertension: Secondary | ICD-10-CM | POA: Diagnosis not present

## 2019-03-31 DIAGNOSIS — R7303 Prediabetes: Secondary | ICD-10-CM

## 2019-03-31 MED ORDER — LEVOTHYROXINE SODIUM 75 MCG PO TABS: ORAL_TABLET | ORAL | 0 refills | Status: DC

## 2019-03-31 NOTE — Progress Notes (Signed)
Virtual Visit via Video   I connected with Sheila [name] on 03/31/19 at 10:00 AM EDT by a telephone pplication and verified that I am speaking with the correct person using two identifiers. Location patient: Home Location provider: [facility], Office Persons participating in the virtual visit: Patient, Dr. Raoul Pitch and R.Baker, LPN  I discussed the limitations of evaluation and management by telemedicine and the availability of in person appointments. The patient expressed understanding and agreed to proceed.  Interactive audio and video telecommunications were attempted between this provider and patient, however failed, due to patient having technical difficulties OR patient did not have access to video capability. We continued and completed visit with audio only.    Subjective:   Chief Complaint  Patient presents with  . Hypertension    No complaints or concerns. Taking BP meds in the afternoon. Yesterday took BP med yesterday at 5pm   . Hypothyroidism    Needs refills on med     HPI:  Hypertension/hypothyroid/prediabetes:  Pt reports compliance with lisinopril 10 mg Qd. Patient denies chest pain, shortness of breath, dizziness or lower extremity edema.  He has not taken her blood pressure medications as of today. She reports compliance with levothyroxine 75 mcg daily in the morning before eating or drinking. Last A1c: 6.6 --> 5.4 BMP: 05/30/2018 WNL CBC: 04/28/2018 WNL Lipids: 04/28/2018 tch 209, HDL 73, LDL 122, tg 71 TSH: 02/17/2018 3.32 Diet: no routine diet and trying to cut as sugar.  Exercise: nothing routine.  RF: HTN, HLD  ROS: See pertinent positives and negatives per HPI.  Patient Active Problem List   Diagnosis Date Noted  . Essential hypertension 04/28/2018  . Lumbar radiculopathy 03/14/2018  . Prediabetes 04/09/2017  . Dysphagia 02/11/2017  . Hyperlipidemia   . GERD (gastroesophageal reflux disease)   . Hypothyroidism     Social History    Tobacco Use  . Smoking status: Never Smoker  . Smokeless tobacco: Never Used  Substance Use Topics  . Alcohol use: No    Current Outpatient Medications:  .  levothyroxine (SYNTHROID, LEVOTHROID) 75 MCG tablet, TAKE 75 MCG BY MOUTH EVERY MORNING, Disp: 90 tablet, Rfl: 3 .  lisinopril (PRINIVIL,ZESTRIL) 10 MG tablet, Take 1 tablet (10 mg total) by mouth daily. OFFICE VISIT NEEDED, Disp: 90 tablet, Rfl: 0 .  pantoprazole (PROTONIX) 40 MG tablet, Take 1 tablet (40 mg total) by mouth daily. (Patient taking differently: Take 40 mg by mouth daily. Taking as needed), Disp: 90 tablet, Rfl: 0 .  Vitamin D, Cholecalciferol, 1000 units TABS, Take 1 tablet by mouth daily with breakfast., Disp: 90 tablet, Rfl: 3  Allergies  Allergen Reactions  . Nsaids     Objective:  BP (!) 148/78   Ht 5\' 2"  (1.575 m)   LMP 02/13/2005   BMI 21.58 kg/m  Gen: No acute distress. Nontoxic in appearance.  HENT: AT. Sheila [name].  MMM.  CV: no edema Chest: Cough or shortness of breath not present.  Neuro:  Alert. Oriented x3  Psych: Normal affect, dress and demeanor. Normal speech. Normal thought content and judgment.   Assessment and Plan:  Sheila [name] is a 63 y.o. present for Essential hypertension -She has not taken her blood pressure medications yet today. -For now, continue lisinopril 10 mg daily. - low sodium diet. -Routine exercise encouraged. -I asked her to monitor her blood pressure over the next 3 to 4 days, at least 2 hours after taking medication in the morning and record.  She will call and let us know these blood pressures on Friday and medication will be refilled for her at appropriate dose. Otherwise if BP normal, will f/u every 6 months as prior  Hypothyroidism: -TSH to be completed this week. -Refills provided for 75 mcg levothyroxine 90 days.  No refills will be provided after lab results.  Prediabetes: -A1c was 6.6 --> 5.4, last visit with diet control.  A1c to be collected this week.   > 25 minutes spent with patient, >50% of time spent face to face counseling    Follow-up 6 months  Howard Pouch, DO 03/31/2019

## 2019-03-31 NOTE — Patient Instructions (Signed)
Future lab appointment for TSH and A1c collection.  Monitor blood pressure for the next few days at least 1-2 hours after taking medication, after seated for 5 to 10 minutes.  Record these blood pressure readings and report them to [place] on this Friday.  We will refill your lisinopril at that time.  Follow-up in 6 months with provider.

## 2019-04-03 ENCOUNTER — Telehealth: Payer: Self-pay | Admitting: Family Medicine

## 2019-04-03 MED ORDER — LISINOPRIL 10 MG PO TABS: 15.0000 mg | ORAL_TABLET | Freq: Every day | ORAL | 1 refills | Status: DC

## 2019-04-03 NOTE — Addendum Note (Signed)
Addended by: Howard Pouch A on: 04/03/2019 04:16 PM   Modules accepted: Orders

## 2019-04-03 NOTE — Telephone Encounter (Signed)
Please inform patient the following information: I have refilled her lisinopril- I increased it to 15 mg a day. This will be 1.5 tabs of lisinopril. She can finish the bottle she has by taking 1.5 tabs- the new bottle will still be 1.5 tabs but she will have more pills in the bottle to last 90 days.

## 2019-04-03 NOTE — Telephone Encounter (Signed)
Sent to Dr Kuneff to review  

## 2019-04-03 NOTE — Telephone Encounter (Signed)
Copied from [place] [id number]. Topic: Quick Communication - See Telephone Encounter >> Apr 03, 2019 11:46 AM Sheran Luz wrote: CRM for notification. See Telephone encounter for: 04/03/19.  Patient states she was advised at last OV to record and report BP readings for the week.   Tuesday morning -140/72 Tuesday afternoon- 130/76   Wednesday morning- 150/85  Wednesday afternoon- 145/82  Thursday morning- 137/79 Thursday afternoon- 116/69 Thursday at 3:30- 132/78   Friday morning-136/80 Friday at 11:30- 131/76

## 2019-04-06 ENCOUNTER — Other Ambulatory Visit: Payer: Self-pay

## 2019-04-06 ENCOUNTER — Telehealth: Payer: Self-pay | Admitting: Family Medicine

## 2019-04-06 ENCOUNTER — Other Ambulatory Visit (INDEPENDENT_AMBULATORY_CARE_PROVIDER_SITE_OTHER): Payer: BLUE CROSS/BLUE SHIELD

## 2019-04-06 DIAGNOSIS — R7303 Prediabetes: Secondary | ICD-10-CM

## 2019-04-06 DIAGNOSIS — E038 Other specified hypothyroidism: Secondary | ICD-10-CM

## 2019-04-06 LAB — HEMOGLOBIN A1C: Hgb A1c MFr Bld: 6.2 % (ref 4.6–6.5)

## 2019-04-06 LAB — TSH: TSH: 2.53 u[IU]/mL (ref 0.35–4.50)

## 2019-04-06 MED ORDER — LEVOTHYROXINE SODIUM 75 MCG PO TABS: ORAL_TABLET | ORAL | 3 refills | Status: DC

## 2019-04-06 NOTE — Telephone Encounter (Signed)
Please inform patient the following information: -TSH is normal.  I have refilled her medications for her for 1 year. -A1c-diabetes screening,  is starting to creep back up at 6.2, was 5.4 when checked in August-6.6 last year this time.   In order not to progress to diabetes and need medications to help control her sugars, I would advise she increase her exercise and again start to cutting back on her carbohydrates and sugars.  Was able to bring this down a great deal last time with diet and exercise changes, she can likely do so again without difficulty. Follow-up in 6 months

## 2019-04-06 NOTE — Telephone Encounter (Signed)
Pt was called and given detailed instructions on med changes. Pt verbalized understanding

## 2019-04-07 NOTE — Telephone Encounter (Signed)
Called pt and was  Unable to leave VM due to not being set up. Will try again later

## 2019-04-08 NOTE — Telephone Encounter (Signed)
Pt was called and given results and pt verbalized understanding

## 2019-06-06 DIAGNOSIS — Z711 Person with feared health complaint in whom no diagnosis is made: Secondary | ICD-10-CM | POA: Diagnosis not present

## 2019-06-29 DIAGNOSIS — L718 Other rosacea: Secondary | ICD-10-CM | POA: Diagnosis not present

## 2019-06-29 DIAGNOSIS — C44311 Basal cell carcinoma of skin of nose: Secondary | ICD-10-CM | POA: Diagnosis not present

## 2019-08-31 DIAGNOSIS — R69 Illness, unspecified: Secondary | ICD-10-CM | POA: Diagnosis not present

## 2019-09-22 DIAGNOSIS — R69 Illness, unspecified: Secondary | ICD-10-CM | POA: Diagnosis not present

## 2019-10-12 ENCOUNTER — Other Ambulatory Visit: Payer: Self-pay

## 2019-10-12 ENCOUNTER — Encounter: Payer: Self-pay | Admitting: Family Medicine

## 2019-10-12 ENCOUNTER — Ambulatory Visit (INDEPENDENT_AMBULATORY_CARE_PROVIDER_SITE_OTHER): Payer: Medicare HMO | Admitting: Family Medicine

## 2019-10-12 VITALS — BP 128/81 | HR 75 | Temp 98.0°F | Resp 18 | Ht 62.0 in | Wt 122.4 lb

## 2019-10-12 DIAGNOSIS — I10 Essential (primary) hypertension: Secondary | ICD-10-CM

## 2019-10-12 DIAGNOSIS — E038 Other specified hypothyroidism: Secondary | ICD-10-CM | POA: Diagnosis not present

## 2019-10-12 DIAGNOSIS — E7849 Other hyperlipidemia: Secondary | ICD-10-CM

## 2019-10-12 DIAGNOSIS — Z23 Encounter for immunization: Secondary | ICD-10-CM | POA: Diagnosis not present

## 2019-10-12 DIAGNOSIS — R7303 Prediabetes: Secondary | ICD-10-CM | POA: Diagnosis not present

## 2019-10-12 LAB — LIPID PANEL
Cholesterol: 214 mg/dL — ABNORMAL HIGH (ref 0–200)
HDL: 63.8 mg/dL (ref >39.00)
LDL Cholesterol: 137 mg/dL — ABNORMAL HIGH (ref 0–99)
NonHDL: 149.78
Total CHOL/HDL Ratio: 3
Triglycerides: 65 mg/dL (ref 0.0–149.0)
VLDL: 13 mg/dL (ref 0.0–40.0)

## 2019-10-12 LAB — CBC
HCT: 38.6 % (ref 36.0–46.0)
Hemoglobin: 12.8 g/dL (ref 12.0–15.0)
MCHC: 33.1 g/dL (ref 30.0–36.0)
MCV: 94.4 fl (ref 78.0–100.0)
Platelets: 308 10*3/uL (ref 150.0–400.0)
RBC: 4.09 Mil/uL (ref 3.87–5.11)
RDW: 12.9 % (ref 11.5–15.5)
WBC: 5.9 10*3/uL (ref 4.0–10.5)

## 2019-10-12 LAB — COMPREHENSIVE METABOLIC PANEL
ALT: 19 U/L (ref 0–35)
AST: 17 U/L (ref 0–37)
Albumin: 4.3 g/dL (ref 3.5–5.2)
Alkaline Phosphatase: 65 U/L (ref 39–117)
BUN: 13 mg/dL (ref 6–23)
CO2: 28 mEq/L (ref 19–32)
Calcium: 9.4 mg/dL (ref 8.4–10.5)
Chloride: 104 mEq/L (ref 96–112)
Creatinine, Ser: 0.71 mg/dL (ref 0.40–1.20)
GFR: 82.55 mL/min (ref >60.00)
Glucose, Bld: 110 mg/dL — ABNORMAL HIGH (ref 70–99)
Potassium: 5.1 mEq/L (ref 3.5–5.1)
Sodium: 137 mEq/L (ref 135–145)
Total Bilirubin: 0.5 mg/dL (ref 0.2–1.2)
Total Protein: 6.6 g/dL (ref 6.0–8.3)

## 2019-10-12 LAB — HEMOGLOBIN A1C: Hgb A1c MFr Bld: 6.1 % (ref 4.6–6.5)

## 2019-10-12 MED ORDER — LISINOPRIL 10 MG PO TABS: 15.0000 mg | ORAL_TABLET | Freq: Every day | ORAL | 1 refills | Status: DC

## 2019-10-12 MED ORDER — OMEPRAZOLE 20 MG PO CPDR: 20.0000 mg | DELAYED_RELEASE_CAPSULE | Freq: Every day | ORAL | 3 refills | Status: DC

## 2019-10-12 MED ORDER — PANTOPRAZOLE SODIUM 40 MG PO TBEC: 40.0000 mg | DELAYED_RELEASE_TABLET | Freq: Every day | ORAL | 3 refills | Status: DC

## 2019-10-12 NOTE — Patient Instructions (Signed)
Great to see you today.  Medications refilled.  Start omeprazole and Pepcid daily for stomach. If not better controlled follow up gastroenterologist.

## 2019-10-12 NOTE — Progress Notes (Signed)
Patient Care Team    Relationship Specialty Notifications Start End  Ma Hillock, DO PCP - General Family Medicine  02/08/17   Ena Dawley, MD Consulting Physician Obstetrics and Gynecology  02/11/17   Irene Shipper, MD Consulting Physician Gastroenterology  02/11/17     Chief Complaint  Patient presents with  . Hypertension    Pt has not been checking BP at home.   . Gastroesophageal Reflux    Pt would like to discuss different medication because protonix is not working as well     HPI:  Hypertension/hypothyroid/prediabetes:  Pt reports compliance with lisinopril 10 mg Qd. Patient denies chest pain, shortness of breath, dizziness or lower extremity edema.  She reports compliance  with levothyroxine 75 mcg daily in the morning before eating or drinking. Cutting back on sugar and only drinking one soda a day.  Last A1c: 6.6 --> 5.4 BMP: 05/30/2018 WNL CBC: 04/28/2018 WNL Lipids: 04/28/2018 tch 209, HDL 73, LDL 122, tg 71 TSH: 03/2019 Diet: no routine diet and trying to cut as sugar.  Exercise: nothing routine.  RF: HTN, HLD  GERD/esophagel stricture-stenosis:  Protonix no longer working per pt. She also ran out of med. She states anything with red sauce or spicy, it creates a great deal of heartburn.  ROS: See pertinent positives and negatives per HPI.  Patient Active Problem List   Diagnosis Date Noted  . Essential hypertension 04/28/2018  . Degeneration of lumbar intervertebral disc 04/23/2018  . Lumbar radiculopathy 03/14/2018  . Prediabetes 04/09/2017  . Dysphagia 02/11/2017  . Hyperlipidemia   . GERD (gastroesophageal reflux disease)   . Hypothyroidism     Social History   Tobacco Use  . Smoking status: Never Smoker  . Smokeless tobacco: Never Used  Substance Use Topics  . Alcohol use: No    Current Outpatient Medications:  .  levothyroxine (SYNTHROID) 75 MCG tablet, TAKE 75 MCG BY MOUTH EVERY MORNING, Disp: 90 tablet, Rfl: 3 .  lisinopril (ZESTRIL)  10 MG tablet, Take 1.5 tablets (15 mg total) by mouth daily., Disp: 135 tablet, Rfl: 1 .  pantoprazole (PROTONIX) 40 MG tablet, Take 1 tablet (40 mg total) by mouth daily., Disp: 90 tablet, Rfl: 3 .  omeprazole (PRILOSEC) 20 MG capsule, Take 1 capsule (20 mg total) by mouth daily., Disp: 90 capsule, Rfl: 3  Allergies  Allergen Reactions  . Nsaids     Objective:  BP 128/81 (BP Location: Right Arm, Patient Position: Sitting, Cuff Size: Normal)   Pulse 75   Temp 98 F (36.7 C) (Temporal)   Resp 18   Ht '5\' 2"'  (1.575 m)   Wt 122 lb 6 oz (55.5 kg)   LMP 02/13/2005   SpO2 99%   BMI 22.38 kg/m  Gen: Afebrile. No acute distress. Female.  HENT: AT. [place]. MMM Eyes:Pupils Equal Round Reactive to light, Extraocular movements intact,  Conjunctiva without redness, discharge or icterus. Neck/lymp/endocrine: Supple,no lymphadenopathy, no thyromegaly CV: RRR no murmur, noedema Chest: CTAB, no wheeze or crackles Abd: Soft. ND. Mild TTP epigastric. BS present. no Masses palpated.  Neuro:  Normal gait. PERLA. EOMi. Alert. Oriented x3  Psych: Normal affect, dress and demeanor. Normal speech. Normal thought content and judgment.  Assessment and Plan:  Sheila [name] is a 63 y.o. present for Essential hypertension - stable.  - continue  lisinopril 15 mg daily. Refills provided.  - cbc, cmp, lipid collected today - low sodium diet. -Routine exercise encouraged. - f/u 6 mos.  Hypothyroidism: -TSH UTD 03/2019 -continue 75 mcg levothyroxine 90 days.  Prediabetes: -A1c was 6.6 --> 5.4>>6.2, last visit with diet control.  A1c to be collected this week. - A1c  Neck:  - Stretches. Provided. Mild discomfort TTP Right SCM.   GERD:  Worsening. Restart pepcid and start omeprazole 20 mg QD as well. Both prescribed today.  - f/u with GI if not seeing improvements in 4-12 weeks.   Influenza vaccine administered today.  Orders Placed This Encounter  Procedures  . Flu Vaccine QUAD High  Dose(Fluad)  . Hemoglobin A1c  . CBC  . Comp Met (CMET)  . Lipid panel     Howard Pouch, DO 10/12/2019

## 2019-12-14 DIAGNOSIS — Z8742 Personal history of other diseases of the female genital tract: Secondary | ICD-10-CM | POA: Diagnosis not present

## 2019-12-14 DIAGNOSIS — Z01419 Encounter for gynecological examination (general) (routine) without abnormal findings: Secondary | ICD-10-CM | POA: Diagnosis not present

## 2019-12-14 DIAGNOSIS — Z6822 Body mass index (BMI) 22.0-22.9, adult: Secondary | ICD-10-CM | POA: Diagnosis not present

## 2019-12-14 DIAGNOSIS — Z1231 Encounter for screening mammogram for malignant neoplasm of breast: Secondary | ICD-10-CM | POA: Diagnosis not present

## 2020-03-15 DIAGNOSIS — R69 Illness, unspecified: Secondary | ICD-10-CM | POA: Diagnosis not present

## 2020-04-04 ENCOUNTER — Telehealth: Payer: Self-pay

## 2020-04-04 NOTE — Telephone Encounter (Signed)
Pt was called and told she would need to call pharmacy to have this med transferred to the new pharmacy. She verbalized understanding

## 2020-04-04 NOTE — Telephone Encounter (Signed)
Patient refill request.  Needs new prescription. She has Medicare now.  Had to change pharmacy.  levothyroxine (SYNTHROID) 75 MCG tablet [id number]   Changed preferred pharmacy to   [place]

## 2020-04-11 ENCOUNTER — Telehealth: Payer: Self-pay

## 2020-04-11 MED ORDER — LEVOTHYROXINE SODIUM 75 MCG PO TABS: ORAL_TABLET | ORAL | 0 refills | Status: DC

## 2020-04-11 NOTE — Telephone Encounter (Signed)
Patient requesting medication refill on her   levothyroxine (SYNTHROID) 75 MCG tablet   [id number] - [place], [place] - [address] AT [address]    Please make sure it goes to this pharmacy the last Rx was sent to [facility] and patient wasn't able to get because they said there was no Rx

## 2020-04-11 NOTE — Telephone Encounter (Signed)
Pt will not have enough medication to last until 04/21/2020 appt. Pt advised 30 day supply would be sent to pharmacy to last until appt and labs could be completed.

## 2020-04-21 ENCOUNTER — Ambulatory Visit (INDEPENDENT_AMBULATORY_CARE_PROVIDER_SITE_OTHER): Payer: Medicare HMO | Admitting: Family Medicine

## 2020-04-21 ENCOUNTER — Other Ambulatory Visit: Payer: Self-pay

## 2020-04-21 ENCOUNTER — Encounter: Payer: Self-pay | Admitting: Family Medicine

## 2020-04-21 VITALS — BP 144/77 | HR 68 | Temp 98.2°F | Resp 18 | Ht 62.0 in | Wt 125.5 lb

## 2020-04-21 DIAGNOSIS — I10 Essential (primary) hypertension: Secondary | ICD-10-CM

## 2020-04-21 DIAGNOSIS — E7849 Other hyperlipidemia: Secondary | ICD-10-CM | POA: Diagnosis not present

## 2020-04-21 DIAGNOSIS — R7303 Prediabetes: Secondary | ICD-10-CM

## 2020-04-21 DIAGNOSIS — K219 Gastro-esophageal reflux disease without esophagitis: Secondary | ICD-10-CM | POA: Diagnosis not present

## 2020-04-21 DIAGNOSIS — E038 Other specified hypothyroidism: Secondary | ICD-10-CM

## 2020-04-21 DIAGNOSIS — E559 Vitamin D deficiency, unspecified: Secondary | ICD-10-CM

## 2020-04-21 LAB — HEMOGLOBIN A1C: Hgb A1c MFr Bld: 6.2 % (ref 4.6–6.5)

## 2020-04-21 LAB — TSH: TSH: 2.54 u[IU]/mL (ref 0.35–4.50)

## 2020-04-21 LAB — VITAMIN D 25 HYDROXY (VIT D DEFICIENCY, FRACTURES): VITD: 18.86 ng/mL — ABNORMAL LOW (ref 30.00–100.00)

## 2020-04-21 MED ORDER — BLOOD PRESSURE KIT: PACK | 0 refills | Status: DC

## 2020-04-21 MED ORDER — LISINOPRIL 10 MG PO TABS: 15.0000 mg | ORAL_TABLET | Freq: Every day | ORAL | 1 refills | Status: DC

## 2020-04-21 NOTE — Patient Instructions (Addendum)
Stop omeprazole. Continue protonix daily and if needing add on then but over the counter pepcid. They work on two different ways to help control reflux.    Monitor Blood pressure at home if routinely over 130/80, then we will need to increase your lisinopril dose   Lisinopril dose currently 1.5 tabs a day.  Follow up in 5.5 mos.    COVID-19 Vaccine Information can be found at: ShippingScam.co.uk For questions related to vaccine distribution or appointments, please email [email or web address] or call 7172884141.  Covid Vaccine appointment go to [email or web address].

## 2020-04-21 NOTE — Progress Notes (Signed)
This visit occurred during the SARS-CoV-2 public health emergency.  Safety protocols were in place, including screening questions prior to the visit, additional usage of staff PPE, and extensive cleaning of exam room while observing appropriate contact time as indicated for disinfecting solutions.    Patient ID: Sheila [name], female  DOB: 1956/06/29, 64 y.o.   MRN: [id number] Patient Care Team    Relationship Specialty Notifications Start End  Ma Hillock, DO PCP - General Family Medicine  02/08/17   Ena Dawley, MD Consulting Physician Obstetrics and Gynecology  02/11/17   Irene Shipper, MD Consulting Physician Gastroenterology  02/11/17     Chief Complaint  Patient presents with  . Hypertension    Needs refills. Pt took BP meds around 9am.   . Hypothyroidism    Subjective: Sheila [name] is a 65 y.o.  Female  present for [facility] Hypertension:  Pt reportscompliancewith lisinopril 15 mg Qd.Patient denies chest pain, shortness of breath, dizziness or lower extremity edema.  Labs UTD Diet: no routine diet and trying to cut as sugar.  Exercise: nothing routine.  RF: HTN, HLD  Hypothyroidism: She reports  plans  with levothyroxine 75 mcg daily in the morning before eating or drinking. Cutting back on sugar and only drinking one soda a day.  Labs due today  Prediabetes Last a1c 6.1. Gained 3 lbs.   GERD/esophagel stricture-stenosis:  Protonix PRN. Not started pepcid. She states anything with red sauce or spicy, it creates a great deal of heartburn.  Depression screen [facility] 2/9 10/12/2019 08/13/2018 04/28/2018 03/31/2018 04/08/2017  Decreased Interest 0 0 0 0 0  Down, Depressed, Hopeless 0 0 0 0 0  PHQ - 2 Score 0 0 0 0 0   No flowsheet data found.   Immunization History  Administered Date(s) Administered  . Fluad Quad(high Dose 65+) 10/12/2019  . Influenza,inj,Quad PF,6+ Mos 08/13/2018  . Zoster Recombinat (Shingrix) 04/08/2017, 06/17/2017    Past Medical  History:  Diagnosis Date  . Allergy   . Esophageal stricture   . GERD (gastroesophageal reflux disease)   . Hiatal hernia   . Hyperlipidemia   . Hypothyroidism    Allergies  Allergen Reactions  . Nsaids    Past Surgical History:  Procedure Laterality Date  . APPENDECTOMY  1967  . BACK SURGERY    . ESOPHAGOGASTRODUODENOSCOPY N/A 02/08/2017   Procedure: ESOPHAGOGASTRODUODENOSCOPY (EGD);  Surgeon: Manus Gunning, MD;  Location: [facility];  Service: Gastroenterology;  Laterality: N/A;  . ESOPHAGOGASTRODUODENOSCOPY (EGD) WITH ESOPHAGEAL DILATION  2004   Family History  Problem Relation Age of Onset  . Diabetes Mother   . Breast cancer Mother   . Hyperlipidemia Mother   . Cancer Father        colon/brain  . Early death Father    Social History   Social History Narrative   Single. 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   Drinks caffeine.   Wears a seatbelt, smoke detector in the home.   Feels safe in her relationships.    Allergies as of 04/21/2020      Reactions   Nsaids       Medication List       Accurate as of Apr 21, 2020 10:05 AM. If you have any questions, ask your nurse or doctor.        levothyroxine 75 MCG tablet Commonly known as: SYNTHROID TAKE 75 MCG BY MOUTH EVERY MORNING   lisinopril 10 MG tablet Commonly known as: ZESTRIL Take  1.5 tablets (15 mg total) by mouth daily.   omeprazole 20 MG capsule Commonly known as: PRILOSEC Take 1 capsule (20 mg total) by mouth daily.   pantoprazole 40 MG tablet Commonly known as: PROTONIX Take 1 tablet (40 mg total) by mouth daily.      All past medical history, surgical history, allergies, family history, immunizations andmedications were updated in the EMR today and reviewed under the history and medication portions of their EMR.     No results found for this or any previous visit (from the past 2160 hour(s)).  No results found.  ROS: 14 pt review of systems performed and negative (unless  mentioned in an HPI)  Objective: BP (!) 144/77 (BP Location: Right Arm, Patient Position: Sitting, Cuff Size: Normal)   Pulse 68   Temp 98.2 F (36.8 C) (Temporal)   Resp 18   Ht 5\' 2"  (1.575 m)   Wt 125 lb 8 oz (56.9 kg)   LMP 02/13/2005   SpO2 100%   BMI 22.95 kg/m  Gen: Afebrile. No acute distress. Nontoxic in appearance, well-developed, well-nourished, pleasant female HENT: AT. [place].  Eyes:Pupils Equal Round Reactive to light, Extraocular movements intact,  Conjunctiva without redness, discharge or icterus. Neck/lymp/endocrine: Supple, no lymphadenopathy, no thyromegaly CV: RRR no murmur, no edema Chest: CTAB, no wheeze, rhonchi or crackles.  Skin: no rashes, purpura or petechiae.  Neuro/Msk: Normal gait. PERLA. EOMi. Alert. Oriented x3.   Psych: Normal affect, dress and demeanor. Normal speech. Normal thought content and judgment.  No exam data present  Assessment/plan: VEDHA CATA is a 64 y.o. female present for [facility] Essential hypertension -Mildly above goal today.  Prescription provided for blood pressure monitoring system.  Patient aware her goal is less than 130/80.  She will check at home and call and if blood pressures are routinely above we will increase the lisinopril 20 mg and have a close follow-up. - continue  lisinopril 15 mg daily. Refills provided.  - low sodium diet. -Routine exercise encouraged. - f/u 6 mos.   Hypothyroidism: -TSH UTD 03/2019 -continue 75 mcg levothyroxine  -TSH collected today  Prediabetes: -A1c was 6.6 --> 5.4>>6.2> A1c collected today  GERD:  Continue the Protonix as needed.  And Pepcid as needed.  Vitamin D: Last vitamin D significantly low.  Will be repeat levels today to ensure adequate vitamin D and bone health.  No follow-ups on file.  No orders of the defined types were placed in this encounter.   No orders of the defined types were placed in this encounter.  No orders of the defined types were placed in this  encounter.  Referral Orders  No referral(s) requested today     Electronically signed by: Howard Pouch, [place]

## 2020-04-22 ENCOUNTER — Telehealth: Payer: Self-pay | Admitting: Family Medicine

## 2020-04-22 MED ORDER — VITAMIN D (ERGOCALCIFEROL) 1.25 MG (50000 UNIT) PO CAPS
50000.0000 [IU] | ORAL_CAPSULE | ORAL | 0 refills | Status: DC
Start: 2020-04-22 — End: 2020-08-11

## 2020-04-22 NOTE — Telephone Encounter (Signed)
Pt was called and given lab results/instructions, she verbalized understanding  

## 2020-04-22 NOTE — Telephone Encounter (Signed)
Please inform patient the following information: -Liver, kidney and thyroid functions are in normal range. Blood cell counts and electrolytes are in normal range. -Vitamin D is extremely low at 18.  I have called in once weekly high-dose vitamin D to take for 12 weeks.  In addition, she should start over-the-counter vitamin D3 1000 units daily. -Cholesterol panel is okay and at goal for her with a total cholesterol 214, HDL 63, LDL/bad cholesterol 137 triglycerides are normal. -Diabetes screen/A1c increased to 6.2 with a fasting sugar of 110 is at the upper end of prediabetes.  This is increased from last collection in October from 6.1 now 6.2. Last year her A1c was 5.4.  Encourage follow-up in 3-4 months to ensure she does not continue to progress to diabetes since her A1c is increasing.  An A1c greater than 6.5 would place her in the diabetic range.  We we will also need to recheck her vitamin D at that time.  Again make sure she understands to continue the 1000 units daily even after the once weekly supplementation is completed

## 2020-04-25 ENCOUNTER — Encounter: Payer: Self-pay | Admitting: Family Medicine

## 2020-05-01 ENCOUNTER — Other Ambulatory Visit: Payer: Self-pay | Admitting: Family Medicine

## 2020-07-01 DIAGNOSIS — M542 Cervicalgia: Secondary | ICD-10-CM | POA: Diagnosis not present

## 2020-07-06 ENCOUNTER — Other Ambulatory Visit: Payer: Self-pay | Admitting: Family Medicine

## 2020-07-22 ENCOUNTER — Ambulatory Visit: Payer: Medicare HMO | Admitting: Family Medicine

## 2020-07-28 ENCOUNTER — Other Ambulatory Visit: Payer: Self-pay

## 2020-07-28 DIAGNOSIS — E038 Other specified hypothyroidism: Secondary | ICD-10-CM

## 2020-07-28 MED ORDER — LEVOTHYROXINE SODIUM 75 MCG PO TABS: ORAL_TABLET | ORAL | 1 refills | Status: DC

## 2020-08-11 ENCOUNTER — Ambulatory Visit (INDEPENDENT_AMBULATORY_CARE_PROVIDER_SITE_OTHER): Payer: Medicare HMO | Admitting: Family Medicine

## 2020-08-11 ENCOUNTER — Other Ambulatory Visit: Payer: Self-pay

## 2020-08-11 ENCOUNTER — Telehealth: Payer: Self-pay | Admitting: Family Medicine

## 2020-08-11 ENCOUNTER — Encounter: Payer: Self-pay | Admitting: Family Medicine

## 2020-08-11 VITALS — BP 137/75 | HR 72 | Temp 98.2°F | Resp 16 | Ht 61.5 in | Wt 122.4 lb

## 2020-08-11 DIAGNOSIS — R7303 Prediabetes: Secondary | ICD-10-CM

## 2020-08-11 DIAGNOSIS — E7849 Other hyperlipidemia: Secondary | ICD-10-CM | POA: Diagnosis not present

## 2020-08-11 DIAGNOSIS — E038 Other specified hypothyroidism: Secondary | ICD-10-CM

## 2020-08-11 DIAGNOSIS — I10 Essential (primary) hypertension: Secondary | ICD-10-CM | POA: Diagnosis not present

## 2020-08-11 DIAGNOSIS — K219 Gastro-esophageal reflux disease without esophagitis: Secondary | ICD-10-CM

## 2020-08-11 DIAGNOSIS — E559 Vitamin D deficiency, unspecified: Secondary | ICD-10-CM | POA: Diagnosis not present

## 2020-08-11 LAB — HEMOGLOBIN A1C: Hgb A1c MFr Bld: 6.2 % (ref 4.6–6.5)

## 2020-08-11 LAB — VITAMIN D 25 HYDROXY (VIT D DEFICIENCY, FRACTURES): VITD: 22.45 ng/mL — ABNORMAL LOW (ref 30.00–100.00)

## 2020-08-11 MED ORDER — VITAMIN D (ERGOCALCIFEROL) 1.25 MG (50000 UNIT) PO CAPS
50000.0000 [IU] | ORAL_CAPSULE | ORAL | 0 refills | Status: DC
Start: 2020-08-11 — End: 2021-01-24

## 2020-08-11 MED ORDER — LEVOTHYROXINE SODIUM 75 MCG PO TABS: ORAL_TABLET | ORAL | 3 refills | Status: DC

## 2020-08-11 MED ORDER — PANTOPRAZOLE SODIUM 40 MG PO TBEC
40.0000 mg | DELAYED_RELEASE_TABLET | Freq: Every day | ORAL | 3 refills | Status: DC
Start: 2020-08-11 — End: 2021-01-24

## 2020-08-11 NOTE — Telephone Encounter (Signed)
Please call patient Vitamin D is mildly improved but still low at 22. Continue with high-dose vitamin D once weekly (refilled for her) and continue vitamin D 1000 units daily (except for on days of high-dose). After this round of high-dose is completed.  She is to continue taking vitamin D 2000 units daily.  We will recheck this on her follow-up appointment due January 24, 2021.  Please encourage her to make this appointment.

## 2020-08-11 NOTE — Patient Instructions (Signed)
Great to see you today.  I have refilled your medications.  Keep track of BP at home a couple times a month. Goal < 130/80.   I will call you with results.  Next appt 5.5 months for Physical and chronic conditions.

## 2020-08-11 NOTE — Progress Notes (Signed)
This visit occurred during the SARS-CoV-2 public health emergency.  Safety protocols were in place, including screening questions prior to the visit, additional usage of staff PPE, and extensive cleaning of exam room while observing appropriate contact time as indicated for disinfecting solutions.    Patient ID: Sheila [name], female  DOB: December 05, 1956, 64 y.o.   MRN: [id number] Patient Care Team    Relationship Specialty Notifications Start End  Ma Hillock, DO PCP - General Family Medicine  02/08/17   Ena Dawley, MD Consulting Physician Obstetrics and Gynecology  02/11/17   Irene Shipper, MD Consulting Physician Gastroenterology  02/11/17     Chief Complaint  Patient presents with   Hypertension    Here for follow up    Subjective: Sheila [name] is a 64 y.o.  Female  present for [facility] Hypertension:  Pt reportscompliancewith lisinopril 15 mg Qd.Patient denies chest pain, shortness of breath, dizziness or lower extremity edema.  Labs UTD Diet: no routine diet and trying to cut as sugar.  Exercise: nothing routine.  RF: HTN, HLD  Hypothyroidism: She reports  plans  with levothyroxine 75 mcg daily in the morning before eating or drinking. Cutting back on sugar and only drinking one soda a day.  Labs UTD  Prediabetes Last a1c 6.2.   GERD/esophagel stricture-stenosis:  Protonix PRN. She feels she is getting better and rarely needs this now. She states anything with red sauce or spicy, it creates a great deal of heartburn.  Depression screen [facility] 2/9 10/12/2019 08/13/2018 04/28/2018 03/31/2018 04/08/2017  Decreased Interest 0 0 0 0 0  Down, Depressed, Hopeless 0 0 0 0 0  PHQ - 2 Score 0 0 0 0 0   No flowsheet data found.   Immunization History  Administered Date(s) Administered   Fluad Quad(high Dose 65+) 10/12/2019   Influenza,inj,Quad PF,6+ Mos 08/13/2018   Influenza,inj,quad, With Preservative 09/25/2019   Zoster Recombinat (Shingrix) 04/08/2017,  06/17/2017    Past Medical History:  Diagnosis Date   Allergy    Esophageal stricture    GERD (gastroesophageal reflux disease)    Hiatal hernia    Hyperlipidemia    Hypothyroidism    Allergies  Allergen Reactions   Nsaids    Past Surgical History:  Procedure Laterality Date   APPENDECTOMY  1967   BACK SURGERY     ESOPHAGOGASTRODUODENOSCOPY N/A 02/08/2017   Procedure: ESOPHAGOGASTRODUODENOSCOPY (EGD);  Surgeon: Manus Gunning, MD;  Location: [facility];  Service: Gastroenterology;  Laterality: N/A;   ESOPHAGOGASTRODUODENOSCOPY (EGD) WITH ESOPHAGEAL DILATION  2004   Family History  Problem Relation Age of Onset   Diabetes Mother    Breast cancer Mother    Hyperlipidemia Mother    Cancer Father        colon/brain   Early death Father    Social History   Social History Narrative   Single. 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   Drinks caffeine.   Wears a seatbelt, smoke detector in the home.   Feels safe in her relationships.    Allergies as of 08/11/2020      Reactions   Nsaids       Medication List       Accurate as of August 11, 2020  9:03 AM. If you have any questions, ask your nurse or doctor.        STOP taking these medications   Vitamin D (Ergocalciferol) 1.25 MG (50000 UNIT) Caps capsule Commonly known as: DRISDOL Stopped by: Howard Pouch,  DO     TAKE these medications   Blood Pressure Kit Monitor BP once daily at least 2 hours after medication.   levothyroxine 75 MCG tablet Commonly known as: SYNTHROID TAKE 1 TABLET BY MOUTH EVERY MORNING   lisinopril 10 MG tablet Commonly known as: ZESTRIL Take 1.5 tablets (15 mg total) by mouth daily.   pantoprazole 40 MG tablet Commonly known as: PROTONIX Take 1 tablet (40 mg total) by mouth daily.      All past medical history, surgical history, allergies, family history, immunizations andmedications were updated in the EMR today and reviewed under the history and  medication portions of their EMR.     No results found for this or any previous visit (from the past 2160 hour(s)).  No results found.  ROS: 14 pt review of systems performed and negative (unless mentioned in an HPI)  Objective: BP 137/75 (BP Location: Left Arm, Patient Position: Sitting, Cuff Size: Small)    Pulse 72    Temp 98.2 F (36.8 C) (Oral)    Resp 16    Ht 5' 1.5" (1.562 m)    Wt 122 lb 6.4 oz (55.5 kg)    LMP 02/13/2005    SpO2 98%    BMI 22.75 kg/m  Gen: Afebrile. No acute distress. Non toxic. Pleasant female.  HENT: AT. [place].  Eyes:Pupils Equal Round Reactive to light, Extraocular movements intact,  Conjunctiva without redness, discharge or icterus. Neck/lymp/endocrine: Supple,no lymphadenopathy, no thyromegaly CV: RRR no murmur, no edema, +2/4 P posterior tibialis pulses Chest: CTAB, no wheeze or crackles Abd: Soft. NTND. BS present. Skin: no rashes, purpura or petechiae.  Neuro:  Normal gait. PERLA. EOMi. Alert. Oriented x3  Psych: Normal affect, dress and demeanor. Normal speech. Normal thought content and judgment.  No exam data present  Assessment/plan: Sheila [name] is a 64 y.o. female present for [facility] Essential hypertension -Rpt ok here. Home pressures even better. She just took her med before leaving the house.  -  Patient aware her goal is less than 130/80.  She will check at home and call and if blood pressures are routinely above we will increase the lisinopril 20 mg and have a close follow-up. - continue  lisinopril 15 mg daily. Refills provided.  - low sodium diet. -Routine exercise encouraged. - f/u 5.5 mos.   Hypothyroidism: -TSH UTD 03/2019 -continue 75 mcg levothyroxine  -TSH collected today  Prediabetes: -A1c was 6.6 --> 5.4>>6.2> 6.1>6.2> collected today  GERD:  continue the Protonix as needed and Pepcid as needed.  Vitamin D: Completed high dose- and now take Vit D 1000u.  Last vitamin D significantly low 16.  Repeat levels today.     Return in about 5 months (around 01/24/2021) for CPE (30 min), [facility] (30 min).  Orders Placed This Encounter  Procedures   Vitamin D (25 hydroxy)   Hemoglobin A1c    Orders Placed This Encounter  Procedures   Vitamin D (25 hydroxy)   Hemoglobin A1c   Meds ordered this encounter  Medications   pantoprazole (PROTONIX) 40 MG tablet    Sig: Take 1 tablet (40 mg total) by mouth daily.    Dispense:  90 tablet    Refill:  3   levothyroxine (SYNTHROID) 75 MCG tablet    Sig: TAKE 1 TABLET BY MOUTH EVERY MORNING    Dispense:  90 tablet    Refill:  3   Referral Orders  No referral(s) requested today     Electronically signed by:  Howard Pouch, DO [place]

## 2020-08-11 NOTE — Telephone Encounter (Signed)
Patient advised of lab results and provider recommendations, voiced understanding.  Follow up scheduled 01/25/20 @ 8:30am.

## 2020-09-20 DIAGNOSIS — R69 Illness, unspecified: Secondary | ICD-10-CM | POA: Diagnosis not present

## 2020-10-16 ENCOUNTER — Other Ambulatory Visit: Payer: Self-pay | Admitting: Family Medicine

## 2020-10-16 DIAGNOSIS — I10 Essential (primary) hypertension: Secondary | ICD-10-CM

## 2020-10-16 DIAGNOSIS — E7849 Other hyperlipidemia: Secondary | ICD-10-CM

## 2020-11-08 ENCOUNTER — Telehealth: Payer: Self-pay | Admitting: Family Medicine

## 2020-11-08 NOTE — Telephone Encounter (Signed)
Advised pt to buy the over the counter Vitamin D and continue to take that as discussed.

## 2020-11-08 NOTE — Telephone Encounter (Signed)
Patient is out of VitD and wonders if Dr. Raoul Pitch wants her to continue taking it. She states her VitD continues to be low. Please call patient to discuss. Patient is aware Dr. Raoul Pitch is out of the office until next week.

## 2021-01-24 ENCOUNTER — Other Ambulatory Visit: Payer: Self-pay

## 2021-01-24 ENCOUNTER — Encounter: Payer: Self-pay | Admitting: Family Medicine

## 2021-01-24 ENCOUNTER — Telehealth: Payer: Self-pay | Admitting: Family Medicine

## 2021-01-24 ENCOUNTER — Ambulatory Visit (INDEPENDENT_AMBULATORY_CARE_PROVIDER_SITE_OTHER): Payer: Medicare HMO | Admitting: Family Medicine

## 2021-01-24 VITALS — BP 138/74 | HR 74 | Temp 97.6°F | Ht 61.5 in | Wt 124.0 lb

## 2021-01-24 DIAGNOSIS — E7849 Other hyperlipidemia: Secondary | ICD-10-CM

## 2021-01-24 DIAGNOSIS — K219 Gastro-esophageal reflux disease without esophagitis: Secondary | ICD-10-CM | POA: Diagnosis not present

## 2021-01-24 DIAGNOSIS — E038 Other specified hypothyroidism: Secondary | ICD-10-CM

## 2021-01-24 DIAGNOSIS — E559 Vitamin D deficiency, unspecified: Secondary | ICD-10-CM

## 2021-01-24 DIAGNOSIS — R7303 Prediabetes: Secondary | ICD-10-CM | POA: Diagnosis not present

## 2021-01-24 DIAGNOSIS — I10 Essential (primary) hypertension: Secondary | ICD-10-CM | POA: Diagnosis not present

## 2021-01-24 LAB — COMPREHENSIVE METABOLIC PANEL
ALT: 14 U/L (ref 0–35)
AST: 14 U/L (ref 0–37)
Albumin: 4.3 g/dL (ref 3.5–5.2)
Alkaline Phosphatase: 65 U/L (ref 39–117)
BUN: 14 mg/dL (ref 6–23)
CO2: 29 mEq/L (ref 19–32)
Calcium: 9.6 mg/dL (ref 8.4–10.5)
Chloride: 103 mEq/L (ref 96–112)
Creatinine, Ser: 0.67 mg/dL (ref 0.40–1.20)
GFR: 90.99 mL/min (ref >60.00)
Glucose, Bld: 115 mg/dL — ABNORMAL HIGH (ref 70–99)
Potassium: 4.1 mEq/L (ref 3.5–5.1)
Sodium: 136 mEq/L (ref 135–145)
Total Bilirubin: 0.4 mg/dL (ref 0.2–1.2)
Total Protein: 6.8 g/dL (ref 6.0–8.3)

## 2021-01-24 LAB — CBC
HCT: 39.5 % (ref 36.0–46.0)
Hemoglobin: 13 g/dL (ref 12.0–15.0)
MCHC: 33 g/dL (ref 30.0–36.0)
MCV: 93.6 fl (ref 78.0–100.0)
Platelets: 333 10*3/uL (ref 150.0–400.0)
RBC: 4.21 Mil/uL (ref 3.87–5.11)
RDW: 13.1 % (ref 11.5–15.5)
WBC: 7.6 10*3/uL (ref 4.0–10.5)

## 2021-01-24 LAB — LIPID PANEL
Cholesterol: 225 mg/dL — ABNORMAL HIGH (ref 0–200)
HDL: 67.3 mg/dL (ref >39.00)
LDL Cholesterol: 143 mg/dL — ABNORMAL HIGH (ref 0–99)
NonHDL: 157.94
Total CHOL/HDL Ratio: 3
Triglycerides: 77 mg/dL (ref 0.0–149.0)
VLDL: 15.4 mg/dL (ref 0.0–40.0)

## 2021-01-24 LAB — VITAMIN D 25 HYDROXY (VIT D DEFICIENCY, FRACTURES): VITD: 21.92 ng/mL — ABNORMAL LOW (ref 30.00–100.00)

## 2021-01-24 LAB — TSH: TSH: 1.39 u[IU]/mL (ref 0.35–4.50)

## 2021-01-24 LAB — HEMOGLOBIN A1C: Hgb A1c MFr Bld: 6.2 % (ref 4.6–6.5)

## 2021-01-24 MED ORDER — ROSUVASTATIN CALCIUM 10 MG PO TABS: 10.0000 mg | ORAL_TABLET | Freq: Every day | ORAL | 3 refills | Status: DC

## 2021-01-24 MED ORDER — LISINOPRIL 20 MG PO TABS: 20.0000 mg | ORAL_TABLET | Freq: Every day | ORAL | 1 refills | Status: DC

## 2021-01-24 MED ORDER — PANTOPRAZOLE SODIUM 40 MG PO TBEC
40.0000 mg | DELAYED_RELEASE_TABLET | Freq: Every day | ORAL | 3 refills | Status: DC
Start: 2021-01-24 — End: 2022-01-23

## 2021-01-24 MED ORDER — LEVOTHYROXINE SODIUM 75 MCG PO TABS: ORAL_TABLET | ORAL | 3 refills | Status: DC

## 2021-01-24 NOTE — Telephone Encounter (Signed)
Please call patient: Her kidneys, liver and thyroid functions are normal.  I have refilled her thyroid medication. Her blood cell counts are normal. Her diabetes screening is the same at 6.2.  This is still in the prediabetic range.  Would encourage her to get back into exercising routinely.  And avoid sugary snacks and desserts.  Vitamin D is low at 22.  I would encourage her to increase her daily vitamin D intake to 4000 units daily with food.  Lastly, her cholesterol is elevated.  Course routine exercise greater than 150 minutes a week in low saturated fat diet will be helpful.  Avoiding red meats, fatty meats, butter and oily snacks will be helpful.  Her cholesterol is at the level that puts her at higher risk of heart attack and stroke with her age, hypertension, prediabetes and cholesterol panel.  Have called in a medication called Crestor at a very low dose.  This helps decrease cholesterol and also has cardiovascular protection from heart attack and stroke.  This is taken before bed.   Follow-up in 3 months with provider to recheck vitamin D and cholesterol panel after starting medications.

## 2021-01-24 NOTE — Progress Notes (Signed)
This visit occurred during the SARS-CoV-2 public health emergency.  Safety protocols were in place, including screening questions prior to the visit, additional usage of staff PPE, and extensive cleaning of exam room while observing appropriate contact time as indicated for disinfecting solutions.    Patient ID: Sheila [name], female  DOB: 07-28-1956, 65 y.o.   MRN: [id number] Patient Care Team    Relationship Specialty Notifications Start End  Ma Hillock, DO PCP - General Family Medicine  02/08/17   Ena Dawley, MD Consulting Physician Obstetrics and Gynecology  02/11/17   Irene Shipper, MD Consulting Physician Gastroenterology  02/11/17     Chief Complaint  Patient presents with  . Follow-up    CMC; pt is fasting     Subjective: Sheila [name] is a 65 y.o.  Female  present for [facility] Hypertension:  Pt reportscomplaincewith lisinopril 15 mg Qd.Patient denies chest pain, shortness of breath, dizziness or lower extremity edema.  Labs due Diet: no routine diet and trying to cut as sugar.  Exercise: nothing routine.  RF: HTN, HLD  Hypothyroidism: She reports compliance with levothyroxine 75 mcg daily in the morning before eating or drinking.  Prediabetes Last a1c 6.2.> 6.2  GERD/esophagel stricture-stenosis:  Protonix still prn. She feels she is getting better and rarely needs this now. She states anything with red sauce or spicy, it creates a great deal of heartburn.  Depression screen [facility] 2/9 01/24/2021 10/12/2019 08/13/2018 04/28/2018 03/31/2018  Decreased Interest 0 0 0 0 0  Down, Depressed, Hopeless 0 0 0 0 0  PHQ - 2 Score 0 0 0 0 0   No flowsheet data found.   Immunization History  Administered Date(s) Administered  . Fluad Quad(high Dose 65+) 10/12/2019  . Influenza,inj,Quad PF,6+ Mos 08/13/2018  . Influenza,inj,quad, With Preservative 09/25/2019  . Zoster Recombinat (Shingrix) 04/08/2017, 06/17/2017    Past Medical History:  Diagnosis Date  .  Allergy   . Esophageal stricture   . GERD (gastroesophageal reflux disease)   . Hiatal hernia   . Hyperlipidemia   . Hypothyroidism    Allergies  Allergen Reactions  . Nsaids    Past Surgical History:  Procedure Laterality Date  . APPENDECTOMY  1967  . BACK SURGERY    . ESOPHAGOGASTRODUODENOSCOPY N/A 02/08/2017   Procedure: ESOPHAGOGASTRODUODENOSCOPY (EGD);  Surgeon: Manus Gunning, MD;  Location: [facility];  Service: Gastroenterology;  Laterality: N/A;  . ESOPHAGOGASTRODUODENOSCOPY (EGD) WITH ESOPHAGEAL DILATION  2004   Family History  Problem Relation Age of Onset  . Diabetes Mother   . Breast cancer Mother   . Hyperlipidemia Mother   . Cancer Father        colon/brain  . Early death Father    Social History   Social History Narrative   Single. 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   Drinks caffeine.   Wears a seatbelt, smoke detector in the home.   Feels safe in her relationships.    Allergies as of 01/24/2021      Reactions   Nsaids       Medication List       Accurate as of January 24, 2021  9:09 AM. If you have any questions, ask your nurse or doctor.        STOP taking these medications   Blood Pressure Kit Stopped by: Howard Pouch, DO   fluconazole 150 MG tablet Commonly known as: DIFLUCAN Stopped by: Howard Pouch, DO   Vitamin D (Ergocalciferol) 1.25 MG (50000  UNIT) Caps capsule Commonly known as: DRISDOL Stopped by: Howard Pouch, DO     TAKE these medications   cholecalciferol 25 MCG (1000 UNIT) tablet Commonly known as: VITAMIN D3 Take 1,000 Units by mouth daily.   levothyroxine 75 MCG tablet Commonly known as: SYNTHROID TAKE 1 TABLET BY MOUTH EVERY MORNING   lisinopril 20 MG tablet Commonly known as: ZESTRIL Take 1 tablet (20 mg total) by mouth daily. What changed:   medication strength  See the new instructions. Changed by: Howard Pouch, DO   pantoprazole 40 MG tablet Commonly known as: PROTONIX Take 1 tablet  (40 mg total) by mouth daily.      All past medical history, surgical history, allergies, family history, immunizations andmedications were updated in the EMR today and reviewed under the history and medication portions of their EMR.     No results found for this or any previous visit (from the past 2160 hour(s)).  No results found.  ROS: 14 pt review of systems performed and negative (unless mentioned in an HPI)  Objective: BP 138/74   Pulse 74   Temp 97.6 F (36.4 C) (Oral)   Ht 5' 1.5" (1.562 m)   Wt 124 lb (56.2 kg)   LMP 02/13/2005   SpO2 97%   BMI 23.05 kg/m  Gen: Afebrile. No acute distress.  HENT: AT. [place]. No cough or shortness of breath Eyes:Pupils Equal Round Reactive to light, Extraocular movements intact,  Conjunctiva without redness, discharge or icterus. Neck/lymp/endocrine: Supple,no lymphadenopathy, no thyromegaly CV: RRR no murmur, no edema, +2/4 P posterior tibialis pulses Chest: CTAB, no wheeze or crackles  Neuro:  Normal gait. PERLA. EOMi. Alert. Oriented x3  Psych: Normal affect, dress and demeanor. Normal speech. Normal thought content and judgment..   No exam data present  Assessment/plan: Sheila [name] is a 65 y.o. female present for [facility] Essential hypertension - above goal  -  goal is less than 130/80. - increase lisinopril to 20 mg QD - low sodium diet. -Routine exercise encouraged. - f/u 5.5 mos.   Hypothyroidism: -continue 75 mcg levothyroxine Qd,, unless lab indicates need to alter dose.  - TSH collected today  Prediabetes: -A1c was 6.6 --> 5.4>>6.2> 6.1>6.2> 6.2>collected today  GERD:  Continue Protonix as needed and Pepcid as needed.  Vitamin D: Supplementing Vit D 2000u.  Vit d collected today    Return in about 5 months (around 07/03/2021) for [place] (30 min).   Orders Placed This Encounter  Procedures  . Vitamin D (25 hydroxy)  . Hemoglobin A1c  . Comp Met (CMET)  . CBC  . Lipid panel  . TSH   Meds ordered this  encounter  Medications  . lisinopril (ZESTRIL) 20 MG tablet    Sig: Take 1 tablet (20 mg total) by mouth daily.    Dispense:  90 tablet    Refill:  1   Referral Orders  No referral(s) requested today     Electronically signed by: Howard Pouch, [place]

## 2021-01-24 NOTE — Patient Instructions (Addendum)
Great to see you today.  We changed your blood pressure management to lisinopril 20 mg a day. New pill will be 20 mg per pill.    We will call you with lab results.   Next appt in 5.5 mos.

## 2021-01-25 NOTE — Telephone Encounter (Signed)
Reviewed results and recommendations with patient, voiced understanding.  3 month f/u appt scheduled.

## 2021-01-27 ENCOUNTER — Telehealth: Payer: Self-pay | Admitting: Family Medicine

## 2021-01-27 NOTE — Telephone Encounter (Signed)
Patient was started on rosuvastatin 01/24/21. For the past two days, she thinks it is giving her nervousness and jitters. Patient wonders if she needs to add something for anxiety. Please call patient to advise.

## 2021-01-27 NOTE — Telephone Encounter (Signed)
Spoke with pt regarding provider's instructions.  

## 2021-01-27 NOTE — Telephone Encounter (Signed)
I would encourage her to stop the medicine for the next few days and see if the nervousness and jitteriness resolves.  If so that is possibly could come from the statin although that is not a normal side effect we see.  Let us know next week if her symptoms are resolved.  If they are not resolved and I suggest she make an appointment so that we can further evaluate the causes of her jitteriness.  If it is resolving we know the Crestor was causing she can stay off the medication.

## 2021-01-27 NOTE — Telephone Encounter (Signed)
Please advise of any medication changes needed

## 2021-03-06 ENCOUNTER — Telehealth: Payer: Self-pay | Admitting: Family Medicine

## 2021-03-06 NOTE — Telephone Encounter (Signed)
Attempted to schedule AWV. Unable to LVM.  Will try at later time.  

## 2021-04-10 ENCOUNTER — Other Ambulatory Visit: Payer: Self-pay

## 2021-04-10 ENCOUNTER — Encounter: Payer: Self-pay | Admitting: Family Medicine

## 2021-04-10 ENCOUNTER — Encounter: Payer: Medicare HMO | Admitting: Family Medicine

## 2021-04-10 ENCOUNTER — Telehealth: Payer: Self-pay

## 2021-04-10 NOTE — Telephone Encounter (Signed)
Please assist patient with scheduling, thanks.  [facility] - Client TELEPHONE ADVICE RECORD [facility] Patient Name: Sheila [name] Gender: Female DOB: September 04, 1956 Age: 65 Y 105 M 6 D Return Phone Number: [phone] (Primary) Address: City/ State/ Zip: [place]  [address] Client [facility] - Client Client Site [facility] - Day Physician Raoul Pitch, [place] Contact Type Call Who Is Calling Patient / Member / Family / Caregiver Call Type Triage / Clinical Relationship To Patient Self Return Phone Number [phone] (Primary) Chief Complaint CHEST PAIN - pain, pressure, heaviness or tightness Reason for Call Symptomatic / Request for [name] states she has a cold now and cant get rid of it. She is having stuffy nose, and now in chest with cough. Translation No Nurse Assessment Nurse: Jearld Pies, RN, Lovena Le Date/Time Eilene Ghazi Time): 04/10/2021 8:10:37 AM Confirm and document reason for call. If symptomatic, describe symptoms. ---Caller states she has a cold now and cant get rid of it. Productive cough (white) and congestion started last Sat. Drinking and urinating normally. Denies difficulty breathing, chest pain, fever, or any other symptoms at this time. Does the patient have any new or worsening symptoms? ---Yes Will a triage be completed? ---Yes Related visit to physician within the last 2 weeks? ---No Does the PT have any chronic conditions? (i.e. diabetes, asthma, this includes High risk factors for pregnancy, etc.) ---Yes List chronic conditions. ---High cholesterol, HTN, Thyroid, Is this a behavioral health or substance abuse call? ---No Guidelines Guideline Title Affirmed Question Affirmed Notes Nurse Date/Time ([place] Time) COVID-19 - Diagnosed or Suspected [1] Continuous (nonstop) coughing interferes with work or school AND [2] no improvement  using cough treatment per Care Advice Jearld Pies, RN, Lovena Le 04/10/2021 8:12:14 AM PLEASE NOTE: All timestamps contained within this report are represented as [place] Standard Time. CONFIDENTIALTY NOTICE: This fax transmission is intended only for the addressee. It contains information that is legally privileged, confidential or otherwise protected from use or disclosure. If you are not the intended recipient, you are strictly prohibited from reviewing, disclosing, copying using or disseminating any of this information or taking any action in reliance on or regarding this information. If you have received this fax in error, please notify us immediately by telephone so that we can arrange for its return to [place]. Phone: [phone], Toll-Free: [phone], Fax: [phone] Page: 2 of 2 Call Id: [id number] [place]. Time Eilene Ghazi Time) Disposition Final User 04/10/2021 8:09:14 AM Send to Urgent Queue Cristy Folks 04/10/2021 8:14:45 AM Call PCP within 24 Hours Yes Jearld Pies, RN, Lovena Le Caller Disagree/Comply Comply Caller Understands Yes PreDisposition Call Doctor Care Advice Given Per Guideline CALL PCP WITHIN 24 HOURS: * You become worse * Chest pain or difficulty breathing occurs * Fever over 103 F (39.4 C) CALL BACK IF: * ACETAMINOPHEN REGULAR STRENGTH TYLENOL: Take 650 mg (two 325 mg pills) by mouth every 4-6 hours as needed. Each Regular Strength Tylenol pill has 325 mg of acetaminophen. The most you should take each day is 3,250 mg (10 pills a day). * The goal of fever therapy is to bring the fever down to a comfortable level. Remember that fever medicine usually lowers fever 2 degrees F (1 - 1 1/2 degrees C). * They are over-the-counter (OTC) drugs that help treat both fever and pain. You can buy them at the drugstore. * For fevers above 101 F (38.3 C) take either acetaminophen or ibuprofen. FEVER MEDICINES: * COUGH DROPS: Over-the-counter  cough drops can help a lot, especially for mild coughs.  They soothe an irritated throat and remove the tickle sensation in the back of the throat. Cough drops are easy to carry with you. COUGH MEDICINES: Comments User: Malissa Hippo, RN Date/Time Eilene Ghazi Time): 04/10/2021 8:17:38 AM Contacted back line for appt. Staff advises "I will call pt back directly." Pt made aware and verbalizes understanding. Referrals REFERRED TO PCP OFFICE

## 2021-04-11 NOTE — Progress Notes (Signed)
Patient canceled the appointment during check-in process

## 2021-04-27 ENCOUNTER — Other Ambulatory Visit: Payer: Self-pay

## 2021-04-27 ENCOUNTER — Ambulatory Visit (INDEPENDENT_AMBULATORY_CARE_PROVIDER_SITE_OTHER): Payer: Medicare HMO | Admitting: Family Medicine

## 2021-04-27 ENCOUNTER — Encounter: Payer: Self-pay | Admitting: Family Medicine

## 2021-04-27 VITALS — BP 135/65 | HR 70 | Temp 98.0°F | Ht 62.0 in | Wt 119.0 lb

## 2021-04-27 DIAGNOSIS — R69 Illness, unspecified: Secondary | ICD-10-CM | POA: Diagnosis not present

## 2021-04-27 DIAGNOSIS — E038 Other specified hypothyroidism: Secondary | ICD-10-CM | POA: Diagnosis not present

## 2021-04-27 DIAGNOSIS — F419 Anxiety disorder, unspecified: Secondary | ICD-10-CM

## 2021-04-27 DIAGNOSIS — E559 Vitamin D deficiency, unspecified: Secondary | ICD-10-CM | POA: Diagnosis not present

## 2021-04-27 DIAGNOSIS — R7303 Prediabetes: Secondary | ICD-10-CM | POA: Diagnosis not present

## 2021-04-27 DIAGNOSIS — E7849 Other hyperlipidemia: Secondary | ICD-10-CM

## 2021-04-27 DIAGNOSIS — Z79899 Other long term (current) drug therapy: Secondary | ICD-10-CM

## 2021-04-27 DIAGNOSIS — I10 Essential (primary) hypertension: Secondary | ICD-10-CM

## 2021-04-27 HISTORY — DX: Anxiety disorder, unspecified: F41.9

## 2021-04-27 LAB — LIPID PANEL
Cholesterol: 151 mg/dL (ref 0–200)
HDL: 68.6 mg/dL (ref >39.00)
LDL Cholesterol: 69 mg/dL (ref 0–99)
NonHDL: 82.48
Total CHOL/HDL Ratio: 2
Triglycerides: 65 mg/dL (ref 0.0–149.0)
VLDL: 13 mg/dL (ref 0.0–40.0)

## 2021-04-27 LAB — HEPATIC FUNCTION PANEL
ALT: 24 U/L (ref 0–35)
AST: 21 U/L (ref 0–37)
Albumin: 4.5 g/dL (ref 3.5–5.2)
Alkaline Phosphatase: 69 U/L (ref 39–117)
Bilirubin, Direct: 0.1 mg/dL (ref 0.0–0.3)
Total Bilirubin: 0.5 mg/dL (ref 0.2–1.2)
Total Protein: 6.8 g/dL (ref 6.0–8.3)

## 2021-04-27 LAB — VITAMIN D 25 HYDROXY (VIT D DEFICIENCY, FRACTURES): VITD: 40.28 ng/mL (ref 30.00–100.00)

## 2021-04-27 LAB — HEMOGLOBIN A1C: Hgb A1c MFr Bld: 6.3 % (ref 4.6–6.5)

## 2021-04-27 MED ORDER — LISINOPRIL 30 MG PO TABS: 30.0000 mg | ORAL_TABLET | Freq: Every day | ORAL | 1 refills | Status: DC

## 2021-04-27 MED ORDER — ROSUVASTATIN CALCIUM 10 MG PO TABS: 10.0000 mg | ORAL_TABLET | Freq: Every day | ORAL | 3 refills | Status: DC

## 2021-04-27 MED ORDER — ESCITALOPRAM OXALATE 10 MG PO TABS: 10.0000 mg | ORAL_TABLET | Freq: Every day | ORAL | 1 refills | Status: DC

## 2021-04-27 NOTE — Patient Instructions (Addendum)
  Great to see you today.  I have refilled the medication(s) we provide.   If labs were collected, we will inform you of lab results once received either by echart message or telephone call.   - echart message- for normal results that have been seen by the patient already.   - telephone call: abnormal results or if patient has not viewed results in their echart.   Increased lisinopril to 30 mg a day. New script provided.  Start lexapro daily. Exercise is great for anxiousness.

## 2021-04-27 NOTE — Progress Notes (Signed)
This visit occurred during the SARS-CoV-2 public health emergency.  Safety protocols were in place, including screening questions prior to the visit, additional usage of staff PPE, and extensive cleaning of exam room while observing appropriate contact time as indicated for disinfecting solutions.    Patient ID: Sheila [name], female  DOB: Apr 25, 1956, 65 y.o.   MRN: [id number] Patient Care Team    Relationship Specialty Notifications Start End  Ma Hillock, DO PCP - General Family Medicine  02/08/17   Ena Dawley, MD Consulting Physician Obstetrics and Gynecology  02/11/17   Irene Shipper, MD Consulting Physician Gastroenterology  02/11/17     Chief Complaint  Patient presents with  . Follow-up    CMC  . Vit D def  . Hyperlipidemia    Pt is fasting    Subjective: Sheila [name] is a 65 y.o.  Female  present for [facility] Hypertension/HLD/Statin therapy:  Pt reportscompliance with lisinopril 20 mg Qd and tolerating statin.Patient denies chest pain, shortness of breath, dizziness or lower extremity edema.  Diet: no routine diet and trying to cut as sugar.  Exercise: nothing routine.  RF: HTN, HLD  Hypothyroidism: She reports complaince with levothyroxine 75 mcg daily in the morning before eating or drinking.  Prediabetes Last a1c 6.2.> 6.2>6.2  Anxiousness: Pt reports she has been feeling more anxious lately. She reports her mother was treated for anxiety, but became "hooked" on xanax and she fears addictive medications. She also has family member struggling with drug abuse.   GERD/esophagel stricture-stenosis:  Protonix still prn. She feels she is getting better and rarely needs this now. She states anything with red sauce or spicy, it creates a great deal of heartburn.  Depression screen [facility] 2/9 01/24/2021 10/12/2019 08/13/2018 04/28/2018 03/31/2018  Decreased Interest 0 0 0 0 0  Down, Depressed, Hopeless 0 0 0 0 0  PHQ - 2 Score 0 0 0 0 0   No flowsheet data  found.   Immunization History  Administered Date(s) Administered  . Fluad Quad(high Dose 65+) 10/12/2019  . Influenza,inj,Quad PF,6+ Mos 08/13/2018  . Influenza,inj,quad, With Preservative 09/25/2019  . Zoster Recombinat (Shingrix) 04/08/2017, 06/17/2017    Past Medical History:  Diagnosis Date  . Allergy   . Esophageal stricture   . GERD (gastroesophageal reflux disease)   . Hiatal hernia   . Hyperlipidemia   . Hypothyroidism    Allergies  Allergen Reactions  . Nsaids    Past Surgical History:  Procedure Laterality Date  . APPENDECTOMY  1967  . BACK SURGERY    . ESOPHAGOGASTRODUODENOSCOPY N/A 02/08/2017   Procedure: ESOPHAGOGASTRODUODENOSCOPY (EGD);  Surgeon: Manus Gunning, MD;  Location: [facility];  Service: Gastroenterology;  Laterality: N/A;  . ESOPHAGOGASTRODUODENOSCOPY (EGD) WITH ESOPHAGEAL DILATION  2004   Family History  Problem Relation Age of Onset  . Diabetes Mother   . Breast cancer Mother   . Hyperlipidemia Mother   . Cancer Father        colon/brain  . Early death Father    Social History   Social History Narrative   Single. 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   Drinks caffeine.   Wears a seatbelt, smoke detector in the home.   Feels safe in her relationships.    Allergies as of 04/27/2021      Reactions   Nsaids       Medication List       Accurate as of Apr 27, 2021  9:01 AM. If you  have any questions, ask your nurse or doctor.        cholecalciferol 25 MCG (1000 UNIT) tablet Commonly known as: VITAMIN D3 Take 4,000 Units by mouth daily.   escitalopram 10 MG tablet Commonly known as: Lexapro Take 1 tablet (10 mg total) by mouth daily. Started by: Howard Pouch, DO   levothyroxine 75 MCG tablet Commonly known as: SYNTHROID TAKE 1 TABLET BY MOUTH EVERY MORNING   lisinopril 30 MG tablet Commonly known as: ZESTRIL Take 1 tablet (30 mg total) by mouth daily. What changed:   medication strength  how much to  take Changed by: Howard Pouch, DO   pantoprazole 40 MG tablet Commonly known as: PROTONIX Take 1 tablet (40 mg total) by mouth daily.   rosuvastatin 10 MG tablet Commonly known as: Crestor Take 1 tablet (10 mg total) by mouth daily.      All past medical history, surgical history, allergies, family history, immunizations andmedications were updated in the EMR today and reviewed under the history and medication portions of their EMR.     No results found for this or any previous visit (from the past 2160 hour(s)).  No results found.  ROS: 14 pt review of systems performed and negative (unless mentioned in an HPI)  Objective: BP 135/65   Pulse 70   Temp 98 F (36.7 C) (Oral)   Ht 5\' 2"  (1.575 m)   Wt 119 lb (54 kg)   LMP 02/13/2005   SpO2 100%   BMI 21.77 kg/m  Gen: Afebrile. No acute distress. Nontoxic pleasant.  HENT: AT. [place].  Eyes:Pupils Equal Round Reactive to light, Extraocular movements intact,  Conjunctiva without redness, discharge or icterus. Neck/lymp/endocrine: Supple no  lymphadenopathy, no  thyromegaly CV: RRR no murmur, no edema, +2/4 P posterior tibialis pulses Chest: CTAB, no wheeze or crackles Abd: Soft. NTND. BS present Skin: no rashes, purpura or petechiae.  Neuro: Normal gait. PERLA. EOMi. Alert. Oriented x3 Psych: Normal affect, dress and demeanor. Normal speech. Normal thought content and judgment.    No exam data present  Assessment/plan: Sheila [name] is a 65 y.o. female present for [facility] Essential hypertension - mildly above goal  -  goal is less than 130/80. - increase again  lisinopril to 30 mg QD - low sodium diet. -Routine exercise encouraged. - f/u 5.5 mos.   Hypothyroidism: -continue 75 mcg levothyroxine Qd - labs UTD 01/2021  Prediabetes: -A1c was 6.6 --> 5.4>>6.2> 6.1>6.2> 6.2>collected today  GERD:  Continue Protonix as needed and Pepcid as needed.  Vitamin D: Supplementing Vit D 4000u.  Vit d collected  today  Anxiousness:  Discussed tx for anxiousness.  Start lexapro 10 mg qd, reassured this is not an addictive medication- but must be taken daily to work properly.    Return in about 5 months (around 10/02/2021) for [place] (30 min).   Orders Placed This Encounter  Procedures  . Lipid panel  . VITAMIN D 25 Hydroxy (Vit-D Deficiency, Fractures)  . Hemoglobin A1c  . Hepatic function panel   Meds ordered this encounter  Medications  . rosuvastatin (CRESTOR) 10 MG tablet    Sig: Take 1 tablet (10 mg total) by mouth daily.    Dispense:  90 tablet    Refill:  3  . lisinopril (ZESTRIL) 30 MG tablet    Sig: Take 1 tablet (30 mg total) by mouth daily.    Dispense:  90 tablet    Refill:  1    DC prior doses.  Marland Kitchen  escitalopram (LEXAPRO) 10 MG tablet    Sig: Take 1 tablet (10 mg total) by mouth daily.    Dispense:  90 tablet    Refill:  1   Referral Orders  No referral(s) requested today     Electronically signed by: Howard Pouch, [place]

## 2021-04-28 ENCOUNTER — Telehealth: Payer: Self-pay | Admitting: Family Medicine

## 2021-04-28 MED ORDER — METFORMIN HCL 500 MG PO TABS: 500.0000 mg | ORAL_TABLET | Freq: Two times a day (BID) | ORAL | 1 refills | Status: DC

## 2021-04-28 NOTE — Telephone Encounter (Signed)
Spoke with pt regarding appt, assisted with r/s to 08/10/21 8:30am

## 2021-04-28 NOTE — Addendum Note (Signed)
Addended by: Howard Pouch A on: 04/28/2021 02:18 PM   Modules accepted: Orders

## 2021-04-28 NOTE — Telephone Encounter (Signed)
July appt can be cancelled. Next appt in 3-4 months for [facility]

## 2021-04-28 NOTE — Telephone Encounter (Signed)
Please inform patient the following information: Her vitamin D looks excellent at 40.  Continue supplement. Her cholesterol panel also looks excellent.  Her LDL is now 69, was 143.  And her total cholesterol is 151, was 225.  Continue medication as prescribed. Her A1c did increase slightly to 6.3.  Since she continues to trend up and is now almost out of the prediabetic range and into the diabetic range.  Starting a medicine called metformin now along with dietary changes and exercise can help her avoid diabetic range.  If she is agreeable to start metformin we will call this in for her today.  Follow-up for her routine visit at 5.5 months

## 2021-04-28 NOTE — Telephone Encounter (Signed)
Spoke with pt regarding labs and instructions. Pt agrees to starting rx. Would like to know should she keep [place] on 7/19.

## 2021-05-02 NOTE — Progress Notes (Signed)
Subjective:   Sheila [name] is a 65 y.o. female who presents for an Initial Medicare Annual Wellness Visit.  I connected with Kaedyn today by telephone and verified that I am speaking with the correct person using two identifiers. Location patient: home Location provider: work Persons participating in the virtual visit: patient, Engineer, civil (consulting).    I discussed the limitations, risks, security and privacy concerns of performing an evaluation and management service by telephone and the availability of in person appointments. I also discussed with the patient that there may be a patient responsible charge related to this service. The patient expressed understanding and verbally consented to this telephonic visit.    Interactive audio and video telecommunications were attempted between this provider and patient, however failed, due to patient having technical difficulties OR patient did not have access to video capability.  We continued and completed visit with audio only.  Some vital signs may be absent or patient reported.   Time Spent with patient on telephone encounter: 30 minutes   Review of Systems     Cardiac Risk Factors include: advanced age (>28men, >47 women);hypertension;dyslipidemia     Objective:    Today's Vitals   05/03/21 1545  Weight: 119 lb (54 kg)  Height: 5\' 2"  (1.575 m)   Body mass index is 21.77 kg/m.  Advanced Directives 05/03/2021 02/08/2017  Does Patient Have a Medical Advance Directive? No No  Would patient like information on creating a medical advance directive? No - Patient declined No - Patient declined    Current Medications (verified) Outpatient Encounter Medications as of 05/03/2021  Medication Sig  . cholecalciferol (VITAMIN D3) 25 MCG (1000 UNIT) tablet Take 4,000 Units by mouth daily.  05/05/2021 levothyroxine (SYNTHROID) 75 MCG tablet TAKE 1 TABLET BY MOUTH EVERY MORNING  . lisinopril (ZESTRIL) 30 MG tablet Take 1 tablet (30 mg total) by mouth daily.  .  pantoprazole (PROTONIX) 40 MG tablet Take 1 tablet (40 mg total) by mouth daily.  . rosuvastatin (CRESTOR) 10 MG tablet Take 1 tablet (10 mg total) by mouth daily.  Marland Kitchen escitalopram (LEXAPRO) 10 MG tablet Take 1 tablet (10 mg total) by mouth daily. (Patient not taking: Reported on 05/03/2021)  . metFORMIN (GLUCOPHAGE) 500 MG tablet Take 1 tablet (500 mg total) by mouth 2 (two) times daily with a meal. (Patient not taking: Reported on 05/03/2021)   No facility-administered encounter medications on file as of 05/03/2021.    Allergies (verified) Nsaids   History: Past Medical History:  Diagnosis Date  . Allergy   . Esophageal stricture   . GERD (gastroesophageal reflux disease)   . Hiatal hernia   . Hyperlipidemia   . Hypothyroidism    Past Surgical History:  Procedure Laterality Date  . APPENDECTOMY  1967  . BACK SURGERY    . ESOPHAGOGASTRODUODENOSCOPY N/A 02/08/2017   Procedure: ESOPHAGOGASTRODUODENOSCOPY (EGD);  Surgeon: 02/10/2017, MD;  Location: [facility];  Service: Gastroenterology;  Laterality: N/A;  . ESOPHAGOGASTRODUODENOSCOPY (EGD) WITH ESOPHAGEAL DILATION  2004   Family History  Problem Relation Age of Onset  . Diabetes Mother   . Breast cancer Mother   . Hyperlipidemia Mother   . Cancer Father        colon/brain  . Early death Father    Social History   Socioeconomic History  . Marital status: Divorced    Spouse name: Not on file  . Number of children: Not on file  . Years of education: 27  . Highest education level: Not  on file  Occupational History  . Occupation: birdery  Tobacco Use  . Smoking status: Never Smoker  . Smokeless tobacco: Never Used  Vaping Use  . Vaping Use: Never used  Substance and Sexual Activity  . Alcohol use: No  . Drug use: No  . Sexual activity: Yes    Partners: Male    Birth control/protection: None  Other Topics Concern  . Not on file  Social History Narrative   Single. High school graduate, works at a  birdery.   Drinks caffeine.   Wears a seatbelt, smoke detector in the home.   Feels safe in her relationships.   Social Determinants of Health   Financial Resource Strain: Low Risk   . Difficulty of Paying Living Expenses: Not hard at all  Food Insecurity: No Food Insecurity  . Worried About Charity fundraiser in the Last Year: Never true  . Ran Out of Food in the Last Year: Never true  Transportation Needs: No Transportation Needs  . Lack of Transportation (Medical): No  . Lack of Transportation (Non-Medical): No  Physical Activity: Insufficiently Active  . Days of Exercise per Week: 2 days  . Minutes of Exercise per Session: 30 min  Stress: No Stress Concern Present  . Feeling of Stress : Only a little  Social Connections: Moderately Isolated  . Frequency of Communication with Friends and Family: More than three times a week  . Frequency of Social Gatherings with Friends and Family: More than three times a week  . Attends Religious Services: Never  . Active Member of Clubs or Organizations: No  . Attends Archivist Meetings: Never  . Marital Status: Living with partner    Tobacco Counseling Counseling given: Not Answered   Clinical Intake:  Pre-visit preparation completed: Yes  Pain : No/denies pain     Nutritional Status: BMI of 19-24  Normal Nutritional Risks: None Diabetes: No  How often do you need to have someone help you when you read instructions, pamphlets, or other written materials from your doctor or pharmacy?: 1 - Never  Diabetic?No  Interpreter Needed?: No  Information entered by :: Caroleen Hamman LPN   Activities of Daily Living In your present state of health, do you have any difficulty performing the following activities: 05/03/2021  Hearing? N  Vision? N  Difficulty concentrating or making decisions? N  Walking or climbing stairs? N  Dressing or bathing? N  Doing errands, shopping? N  Preparing Food and eating ? N  Using the  Toilet? N  In the past six months, have you accidently leaked urine? N  Do you have problems with loss of bowel control? N  Managing your Medications? N  Managing your Finances? N  Housekeeping or managing your Housekeeping? N  Some recent data might be hidden    Patient Care Team: Ma Hillock, DO as PCP - General (Family Medicine) Ena Dawley, MD as Consulting Physician (Obstetrics and Gynecology) Irene Shipper, MD as Consulting Physician (Gastroenterology)  Indicate any recent Medical Services you may have received from other than [facility] providers in the past year (date may be approximate).     Assessment:   This is a routine wellness examination for Pixley.  Hearing/Vision screen  Hearing Screening   125Hz  250Hz  500Hz  1000Hz  2000Hz  3000Hz  4000Hz  6000Hz  8000Hz   Right ear:           Left ear:           Comments: No issues  Vision  Screening Comments: Last eye exam-years ago  Dietary issues and exercise activities discussed: Current Exercise Habits: Home exercise routine, Type of exercise: walking, Time (Minutes): 30, Frequency (Times/Week): 2, Weekly Exercise (Minutes/Week): 60, Intensity: Mild, Exercise limited by: None identified  Goals Addressed            This Visit's Progress   . Patient Stated       Eat healthier & increase activity      Depression Screen PHQ 2/9 Scores 05/03/2021 01/24/2021 10/12/2019 08/13/2018 04/28/2018 03/31/2018 04/08/2017  PHQ - 2 Score 0 0 0 0 0 0 0    Fall Risk Fall Risk  05/03/2021 01/24/2021 08/13/2018 04/28/2018 04/08/2017  Falls in the past year? 0 0 No No No  Number falls in past yr: 0 0 - - -  Injury with Fall? 0 0 - - -  Follow up Falls prevention discussed Falls evaluation completed - - -    FALL RISK PREVENTION PERTAINING TO THE HOME:  Any stairs in or around the home? No  Home free of loose throw rugs in walkways, pet beds, electrical cords, etc? Yes  Adequate lighting in your home to reduce risk of falls? Yes    ASSISTIVE DEVICES UTILIZED TO PREVENT FALLS:  Life alert? No  Use of a cane, walker or w/c? No  Grab bars in the bathroom? No  Shower chair or bench in shower? No  Elevated toilet seat or a handicapped toilet? No   TIMED UP AND GO:  Was the test performed? No phone visit.    Cognitive Function:Normal cognitive status assessed by  this Nurse Health Advisor. No abnormalities found.          Immunizations Immunization History  Administered Date(s) Administered  . Fluad Quad(high Dose 65+) 10/12/2019  . Influenza,inj,Quad PF,6+ Mos 08/13/2018  . Influenza,inj,quad, With Preservative 09/25/2019  . Zoster Recombinat (Shingrix) 04/08/2017, 06/17/2017    TDAP status: Up to date  Flu Vaccine status: Due, Education has been provided regarding the importance of this vaccine. Advised may receive this vaccine at local pharmacy or Health Dept. Aware to provide a copy of the vaccination record if obtained from local pharmacy or Health Dept. Verbalized acceptance and understanding.  Pneumococcal vaccine status: Declined,  Education has been provided regarding the importance of this vaccine but patient still declined. Advised may receive this vaccine at local pharmacy or Health Dept. Aware to provide a copy of the vaccination record if obtained from local pharmacy or Health Dept. Verbalized acceptance and understanding.   Covid-19 vaccine status: Declined, Education has been provided regarding the importance of this vaccine but patient still declined. Advised may receive this vaccine at local pharmacy or Health Dept.or vaccine clinic. Aware to provide a copy of the vaccination record if obtained from local pharmacy or Health Dept. Verbalized acceptance and understanding.  Qualifies for Shingles Vaccine? No   Zostavax completed No   Shingrix Completed?: Yes  Screening Tests Health Maintenance  Topic Date Due  . COVID-19 Vaccine (1) 05/13/2021 (Originally 07/05/1961)  . DEXA SCAN   08/26/2021 (Originally 03/12/2019)  . COLONOSCOPY (Pts 45-69yrs Insurance coverage will need to be confirmed)  01/24/2022 (Originally 07/05/1999)  . PNA vac Low Risk Adult (1 of 2 - PCV13) 01/24/2022 (Originally 07/05/2019)  . MAMMOGRAM  04/27/2022 (Originally 11/13/2019)  . INFLUENZA VACCINE  07/17/2021  . TETANUS/TDAP  12/18/2023  . Hepatitis C Screening  Completed  . HPV VACCINES  Aged Out    Health Maintenance  There are no preventive care  reminders to display for this patient.  Colorectal cancer screening: Due- Declined  Mammogram-Due-Patient states she will have done at GYN office  Bone Density status:Due-Patient states she will have done at GYN office  Lung Cancer Screening: (Low Dose CT Chest recommended if Age 28-80 years, 30 pack-year currently smoking OR have quit w/in 15years.) does not qualify.     Additional Screening:  Hepatitis C Screening:Completed *04/08/2017  Vision Screening: Recommended annual ophthalmology exams for early detection of glaucoma and other disorders of the eye. Is the patient up to date with their annual eye exam?  No  Who is the provider or what is the name of the office in which the patient attends annual eye exams? unsure If pt is not established with a provider, would they like to be referred to a provider to establish care? No .  Patient states she will schedule  Dental Screening: Recommended annual dental exams for proper oral hygiene  Community Resource Referral / Chronic Care Management: CRR required this visit?  No   CCM required this visit?  No      Plan:     I have personally reviewed and noted the following in the patient's chart:   . Medical and social history . Use of alcohol, tobacco or illicit drugs  . Current medications and supplements including opioid prescriptions. Patient is not currently taking opioid prescriptions. . Functional ability and status . Nutritional status . Physical activity . Advanced  directives . List of other physicians . Hospitalizations, surgeries, and ER visits in previous 12 months . Vitals . Screenings to include cognitive, depression, and falls . Referrals and appointments  In addition, I have reviewed and discussed with patient certain preventive protocols, quality metrics, and best practice recommendations. A written personalized care plan for preventive services as well as general preventive health recommendations were provided to patient.   Due to this being a telephonic visit, the after visit summary with patients personalized plan was offered to patient via mail or my-chart. Patient declined at this time.    Marta Antu, LPN   [id number]  Nurse Health Advisor  Nurse Notes: None

## 2021-05-03 ENCOUNTER — Ambulatory Visit (INDEPENDENT_AMBULATORY_CARE_PROVIDER_SITE_OTHER): Payer: Medicare HMO

## 2021-05-03 VITALS — Ht 62.0 in | Wt 119.0 lb

## 2021-05-03 DIAGNOSIS — Z0000 Encounter for general adult medical examination without abnormal findings: Secondary | ICD-10-CM | POA: Diagnosis not present

## 2021-05-03 NOTE — Patient Instructions (Signed)
Sheila [name] , Thank you for taking time to complete your Medicare Wellness Visit. I appreciate your ongoing commitment to your health goals. Please review the following plan we discussed and let me know if I can assist you in the future.   Screening recommendations/referrals: Colonoscopy: Declined Mammogram: Per our conversation, you will cal GYN to schedule. Bone Density: Per our conversation, you will cal GYN to schedule. Recommended yearly ophthalmology/optometry visit for glaucoma screening and checkup Recommended yearly dental visit for hygiene and checkup  Vaccinations: Influenza vaccine: Due-08/2021 Pneumococcal vaccine: Declined Tdap vaccine: Up to date-Due 2025 Shingles vaccine: Completed vaccines   Covid-19:Declined  Advanced directives: Declined information today  Conditions/risks identified: See problem list  Next appointment: Follow up in one year for your annual wellness visit 05/09/2022 @ 3:45   Preventive Care 67 Years and Older, Female Preventive care refers to lifestyle choices and visits with your health care provider that can promote health and wellness. What does preventive care include?  A yearly physical exam. This is also called an annual well check.  Dental exams once or twice a year.  Routine eye exams. Ask your health care provider how often you should have your eyes checked.  Personal lifestyle choices, including:  Daily care of your teeth and gums.  Regular physical activity.  Eating a healthy diet.  Avoiding tobacco and drug use.  Limiting alcohol use.  Practicing safe sex.  Taking low-dose aspirin every day.  Taking vitamin and mineral supplements as recommended by your health care provider. What happens during an annual well check? The services and screenings done by your health care provider during your annual well check will depend on your age, overall health, lifestyle risk factors, and family history of disease. Counseling  Your  health care provider may ask you questions about your:  Alcohol use.  Tobacco use.  Drug use.  Emotional well-being.  Home and relationship well-being.  Sexual activity.  Eating habits.  History of falls.  Memory and ability to understand (cognition).  Work and work Statistician.  Reproductive health. Screening  You may have the following tests or measurements:  Height, weight, and BMI.  Blood pressure.  Lipid and cholesterol levels. These may be checked every 5 years, or more frequently if you are over 35 years old.  Skin check.  Lung cancer screening. You may have this screening every year starting at age 51 if you have a 30-pack-year history of smoking and currently smoke or have quit within the past 15 years.  Fecal occult blood test (FOBT) of the stool. You may have this test every year starting at age 44.  Flexible sigmoidoscopy or colonoscopy. You may have a sigmoidoscopy every 5 years or a colonoscopy every 10 years starting at age 5.  Hepatitis C blood test.  Hepatitis B blood test.  Sexually transmitted disease (STD) testing.  Diabetes screening. This is done by checking your blood sugar (glucose) after you have not eaten for a while (fasting). You may have this done every 1-3 years.  Bone density scan. This is done to screen for osteoporosis. You may have this done starting at age 4.  Mammogram. This may be done every 1-2 years. Talk to your health care provider about how often you should have regular mammograms. Talk with your health care provider about your test results, treatment options, and if necessary, the need for more tests. Vaccines  Your health care provider may recommend certain vaccines, such as:  Influenza vaccine. This is recommended every  year.  Tetanus, diphtheria, and acellular pertussis (Tdap, Td) vaccine. You may need a Td booster every 10 years.  Zoster vaccine. You may need this after age 20.  Pneumococcal 13-valent  conjugate (PCV13) vaccine. One dose is recommended after age 63.  Pneumococcal polysaccharide (PPSV23) vaccine. One dose is recommended after age 62. Talk to your health care provider about which screenings and vaccines you need and how often you need them. This information is not intended to replace advice given to you by your health care provider. Make sure you discuss any questions you have with your health care provider. Document Released: 12/30/2015 Document Revised: 08/22/2016 Document Reviewed: 10/04/2015 Elsevier Interactive Patient Education  2017 [place] Prevention in the Home Falls can cause injuries. They can happen to people of all ages. There are many things you can do to make your home safe and to help prevent falls. What can I do on the outside of my home?  Regularly fix the edges of walkways and driveways and fix any cracks.  Remove anything that might make you trip as you walk through a door, such as a raised step or threshold.  Trim any bushes or trees on the path to your home.  Use bright outdoor lighting.  Clear any walking paths of anything that might make someone trip, such as rocks or tools.  Regularly check to see if handrails are loose or broken. Make sure that both sides of any steps have handrails.  Any raised decks and porches should have guardrails on the edges.  Have any leaves, snow, or ice cleared regularly.  Use sand or salt on walking paths during winter.  Clean up any spills in your garage right away. This includes oil or grease spills. What can I do in the bathroom?  Use night lights.  Install grab bars by the toilet and in the tub and shower. Do not use towel bars as grab bars.  Use non-skid mats or decals in the tub or shower.  If you need to sit down in the shower, use a plastic, non-slip stool.  Keep the floor dry. Clean up any water that spills on the floor as soon as it happens.  Remove soap buildup in the tub or  shower regularly.  Attach bath mats securely with double-sided non-slip rug tape.  Do not have throw rugs and other things on the floor that can make you trip. What can I do in the bedroom?  Use night lights.  Make sure that you have a light by your bed that is easy to reach.  Do not use any sheets or blankets that are too big for your bed. They should not hang down onto the floor.  Have a firm chair that has side arms. You can use this for support while you get dressed.  Do not have throw rugs and other things on the floor that can make you trip. What can I do in the kitchen?  Clean up any spills right away.  Avoid walking on wet floors.  Keep items that you use a lot in easy-to-reach places.  If you need to reach something above you, use a strong step stool that has a grab bar.  Keep electrical cords out of the way.  Do not use floor polish or wax that makes floors slippery. If you must use wax, use non-skid floor wax.  Do not have throw rugs and other things on the floor that can make you trip. What can  I do with my stairs?  Do not leave any items on the stairs.  Make sure that there are handrails on both sides of the stairs and use them. Fix handrails that are broken or loose. Make sure that handrails are as long as the stairways.  Check any carpeting to make sure that it is firmly attached to the stairs. Fix any carpet that is loose or worn.  Avoid having throw rugs at the top or bottom of the stairs. If you do have throw rugs, attach them to the floor with carpet tape.  Make sure that you have a light switch at the top of the stairs and the bottom of the stairs. If you do not have them, ask someone to add them for you. What else can I do to help prevent falls?  Wear shoes that:  Do not have high heels.  Have rubber bottoms.  Are comfortable and fit you well.  Are closed at the toe. Do not wear sandals.  If you use a stepladder:  Make sure that it is fully  opened. Do not climb a closed stepladder.  Make sure that both sides of the stepladder are locked into place.  Ask someone to hold it for you, if possible.  Clearly mark and make sure that you can see:  Any grab bars or handrails.  First and last steps.  Where the edge of each step is.  Use tools that help you move around (mobility aids) if they are needed. These include:  Canes.  Walkers.  Scooters.  Crutches.  Turn on the lights when you go into a dark area. Replace any light bulbs as soon as they burn out.  Set up your furniture so you have a clear path. Avoid moving your furniture around.  If any of your floors are uneven, fix them.  If there are any pets around you, be aware of where they are.  Review your medicines with your doctor. Some medicines can make you feel dizzy. This can increase your chance of falling. Ask your doctor what other things that you can do to help prevent falls. This information is not intended to replace advice given to you by your health care provider. Make sure you discuss any questions you have with your health care provider. Document Released: 09/29/2009 Document Revised: 05/10/2016 Document Reviewed: 01/07/2015 Elsevier Interactive Patient Education  2017 [employer].

## 2021-06-29 DIAGNOSIS — N952 Postmenopausal atrophic vaginitis: Secondary | ICD-10-CM | POA: Diagnosis not present

## 2021-06-29 DIAGNOSIS — Z1231 Encounter for screening mammogram for malignant neoplasm of breast: Secondary | ICD-10-CM | POA: Diagnosis not present

## 2021-06-29 DIAGNOSIS — Z01419 Encounter for gynecological examination (general) (routine) without abnormal findings: Secondary | ICD-10-CM | POA: Diagnosis not present

## 2021-06-29 DIAGNOSIS — M8589 Other specified disorders of bone density and structure, multiple sites: Secondary | ICD-10-CM | POA: Diagnosis not present

## 2021-06-29 DIAGNOSIS — M8580 Other specified disorders of bone density and structure, unspecified site: Secondary | ICD-10-CM | POA: Insufficient documentation

## 2021-06-29 DIAGNOSIS — N760 Acute vaginitis: Secondary | ICD-10-CM | POA: Diagnosis not present

## 2021-06-29 LAB — HM DEXA SCAN

## 2021-06-29 LAB — HM PAP SMEAR: Pap: NEGATIVE

## 2021-07-01 LAB — HM PAP SMEAR: HM Pap smear: NORMAL

## 2021-07-04 ENCOUNTER — Ambulatory Visit: Payer: Medicare HMO | Admitting: Family Medicine

## 2021-07-25 ENCOUNTER — Other Ambulatory Visit: Payer: Self-pay | Admitting: Family Medicine

## 2021-08-10 ENCOUNTER — Encounter: Payer: Self-pay | Admitting: Family Medicine

## 2021-08-10 ENCOUNTER — Other Ambulatory Visit: Payer: Self-pay

## 2021-08-10 ENCOUNTER — Ambulatory Visit (INDEPENDENT_AMBULATORY_CARE_PROVIDER_SITE_OTHER): Payer: Medicare HMO | Admitting: Family Medicine

## 2021-08-10 VITALS — BP 121/64 | HR 64 | Temp 97.6°F | Ht 62.0 in | Wt 113.0 lb

## 2021-08-10 DIAGNOSIS — R69 Illness, unspecified: Secondary | ICD-10-CM | POA: Diagnosis not present

## 2021-08-10 DIAGNOSIS — M8580 Other specified disorders of bone density and structure, unspecified site: Secondary | ICD-10-CM

## 2021-08-10 DIAGNOSIS — R7303 Prediabetes: Secondary | ICD-10-CM | POA: Diagnosis not present

## 2021-08-10 DIAGNOSIS — I10 Essential (primary) hypertension: Secondary | ICD-10-CM

## 2021-08-10 DIAGNOSIS — Z23 Encounter for immunization: Secondary | ICD-10-CM | POA: Diagnosis not present

## 2021-08-10 DIAGNOSIS — Z79899 Other long term (current) drug therapy: Secondary | ICD-10-CM

## 2021-08-10 DIAGNOSIS — Z2821 Immunization not carried out because of patient refusal: Secondary | ICD-10-CM

## 2021-08-10 DIAGNOSIS — F419 Anxiety disorder, unspecified: Secondary | ICD-10-CM

## 2021-08-10 DIAGNOSIS — K219 Gastro-esophageal reflux disease without esophagitis: Secondary | ICD-10-CM | POA: Diagnosis not present

## 2021-08-10 DIAGNOSIS — E782 Mixed hyperlipidemia: Secondary | ICD-10-CM

## 2021-08-10 DIAGNOSIS — E038 Other specified hypothyroidism: Secondary | ICD-10-CM | POA: Diagnosis not present

## 2021-08-10 DIAGNOSIS — E559 Vitamin D deficiency, unspecified: Secondary | ICD-10-CM

## 2021-08-10 LAB — POCT GLYCOSYLATED HEMOGLOBIN (HGB A1C)
HbA1c POC (<> result, manual entry): 5.9 % (ref 4.0–5.6)
HbA1c, POC (controlled diabetic range): 5.9 % (ref 0.0–7.0)
HbA1c, POC (prediabetic range): 5.9 % (ref 5.7–6.4)
Hemoglobin A1C: 5.9 % — AB (ref 4.0–5.6)

## 2021-08-10 MED ORDER — LEVOTHYROXINE SODIUM 75 MCG PO TABS: ORAL_TABLET | ORAL | 0 refills | Status: DC

## 2021-08-10 MED ORDER — LISINOPRIL 30 MG PO TABS: 30.0000 mg | ORAL_TABLET | Freq: Every day | ORAL | 1 refills | Status: DC

## 2021-08-10 NOTE — Patient Instructions (Addendum)
Great to see you today.  I have refilled the medication(s) we provide.   If labs were collected, we will inform you of lab results once received either by echart message or telephone call.   - echart message- for normal results that have been seen by the patient already.   - telephone call: abnormal results or if patient has not viewed results in their echart.  Next appt in 5.5 months for chronic conditions! You did great!!!

## 2021-08-10 NOTE — Progress Notes (Signed)
This visit occurred during the SARS-CoV-2 public health emergency.  Safety protocols were in place, including screening questions prior to the visit, additional usage of staff PPE, and extensive cleaning of exam room while observing appropriate contact time as indicated for disinfecting solutions.    Patient ID: Sheila [name], female  DOB: [birth year], 65 y.o.   MRN: [id number] Patient Care Team    Relationship Specialty Notifications Start End  Ma Hillock, DO PCP - General Family Medicine  02/08/17   Ena Dawley, MD Consulting Physician Obstetrics and Gynecology  02/11/17   Irene Shipper, MD Consulting Physician Gastroenterology  02/11/17     Chief Complaint  Patient presents with   Hypertension    [place]; pt is fasting;      Subjective: Sheila [name] is a 65 y.o.  Female  present for [facility] Hypertension/HLD/Statin therapy:  Pt reports compliance   with lisinopril 30 mg Qd and tolerating statin. Patient denies chest pain, shortness of breath, dizziness or lower extremity edema.  She lost 6 lbs!!! Diet: no routine diet and trying to cut as sugar.  Exercise: nothing routine.  RF: HTN, HLD  Hypothyroidism: She reports compliance with levothyroxine 75 mcg daily in the morning before eating or drinking.  Prediabetes Last a1c 6.2.> 6.2>6.2>6.3. Prescribed metformin, which she admits she did not start. She did work on her diet and exercise and lost 6lbs!!!  Anxiousness: She was seen 3 month ago and was reporting increase in anxiety. Lexapro was discussed extensively and she elected to try. She reports today she did not like the way it made her feel and had taken med just a few days. She reports it made her feel foggy.  Prior note: Pt reports she has been feeling more anxious lately. She reports her mother was treated for anxiety, but became "hooked" on xanax and she fears addictive medications. She also has family member struggling with drug abuse.   GERD/esophagel  stricture-stenosis:  Protonix still prn. She feels she is getting better and rarely needs this now. She states anything with red sauce or spicy, it creates a great deal of heartburn.   Depression screen [facility] 2/9 05/03/2021 01/24/2021 10/12/2019 08/13/2018 04/28/2018  Decreased Interest 0 0 0 0 0  Down, Depressed, Hopeless 0 0 0 0 0  PHQ - 2 Score 0 0 0 0 0   No flowsheet data found.   Immunization History  Administered Date(s) Administered   Fluad Quad(high Dose 65+) 10/12/2019   Influenza,inj,Quad PF,6+ Mos 08/13/2018, 08/10/2021   Influenza,inj,quad, With Preservative 09/25/2019   Zoster Recombinat (Shingrix) 04/08/2017, 06/17/2017    Past Medical History:  Diagnosis Date   Allergy    Esophageal stricture    GERD (gastroesophageal reflux disease)    Hiatal hernia    Hyperlipidemia    Hypothyroidism    Allergies  Allergen Reactions   Nsaids    Past Surgical History:  Procedure Laterality Date   APPENDECTOMY  1967   BACK SURGERY     ESOPHAGOGASTRODUODENOSCOPY N/A 02/08/2017   Procedure: ESOPHAGOGASTRODUODENOSCOPY (EGD);  Surgeon: Manus Gunning, MD;  Location: [facility];  Service: Gastroenterology;  Laterality: N/A;   ESOPHAGOGASTRODUODENOSCOPY (EGD) WITH ESOPHAGEAL DILATION  2004   Family History  Problem Relation Age of Onset   Diabetes Mother    Breast cancer Mother    Hyperlipidemia Mother    Cancer Father        colon/brain   Early death Father    Social History   Social History  Narrative   Single. 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   Drinks caffeine.   Wears a seatbelt, smoke detector in the home.   Feels safe in her relationships.    Allergies as of 08/10/2021       Reactions   Nsaids         Medication List        Accurate as of August 10, 2021  9:06 AM. If you have any questions, ask your nurse or doctor.          STOP taking these medications    metFORMIN 500 MG tablet Commonly known as: GLUCOPHAGE Stopped by: Howard Pouch, DO       TAKE these medications    cholecalciferol 25 MCG (1000 UNIT) tablet Commonly known as: VITAMIN D3 Take 4,000 Units by mouth daily.   escitalopram 10 MG tablet Commonly known as: Lexapro Take 1 tablet (10 mg total) by mouth daily.   levothyroxine 75 MCG tablet Commonly known as: SYNTHROID TAKE 1 TABLET BY MOUTH EVERY MORNING   lisinopril 30 MG tablet Commonly known as: ZESTRIL Take 1 tablet (30 mg total) by mouth daily.   pantoprazole 40 MG tablet Commonly known as: PROTONIX Take 1 tablet (40 mg total) by mouth daily.   rosuvastatin 10 MG tablet Commonly known as: Crestor Take 1 tablet (10 mg total) by mouth daily.       All past medical history, surgical history, allergies, family history, immunizations andmedications were updated in the EMR today and reviewed under the history and medication portions of their EMR.     Recent Results (from the past 2160 hour(s))  POCT HgB A1C     Status: Abnormal   Collection Time: 08/10/21  8:51 AM  Result Value Ref Range   Hemoglobin A1C 5.9 (A) 4.0 - 5.6 %   HbA1c POC (<> result, manual entry) 5.9 4.0 - 5.6 %   HbA1c, POC (prediabetic range) 5.9 5.7 - 6.4 %   HbA1c, POC (controlled diabetic range) 5.9 0.0 - 7.0 %    No results found.  ROS: 14 pt review of systems performed and negative (unless mentioned in an HPI)  Objective: BP 121/64   Pulse 64   Temp 97.6 F (36.4 C) (Oral)   Ht '5\' 2"'$  (1.575 m)   Wt 113 lb (51.3 kg)   LMP 02/13/2005   SpO2 100%   BMI 20.67 kg/m  Gen: Afebrile. No acute distress. Very pleasant female.  HENT: AT. [place].  Eyes:Pupils Equal Round Reactive to light, Extraocular movements intact,  Conjunctiva without redness, discharge or icterus. CV: RRR no murmur, no edema.  Chest: CTAB, no wheeze or crackles Neuro: Normal gait. PERLA. EOMi. Alert. Oriented x3  Psych: Normal affect, dress and demeanor. Normal speech. Normal thought content and judgment..    No results  found.  Assessment/plan: MAEGHAN GRAPE is a 65 y.o. female present for [facility] Essential hypertension/HLD/on statin therapy - improved today -  goal is less than 130/80. - continue lisinopril to 30 mg QD - continue crestor 10 mg qd.  - low sodium diet. -Routine exercise encouraged. Labs due next visit.  - f/u 5.5 mos.   Hypothyroidism: -continue 75 mcg levothyroxine Qd - labs UTD 01/2021  Prediabetes: -Improved! - A1c was 6.6 --> 5.4>>6.2> 6.1>6.2> 6.2>6.3 > 5.9 today - she did not start metformin- changed diet and lost 6  lbs! Did great!  GERD:  Continue  Protonix as needed and Pepcid as needed.  Vitamin D/osteopenia:  Supplementing Vit D 4000u.  Vit d > normal 04/27/2021 (40)  Anxiousness:  She is feeling better since exercising more and consuming healthier diet.  She reports only mild anxiousness now. We discussed if she felt she could use medication help again she could decrease the lexapro to 5 mg and take before bed. She reports she may try that.  +/- lexapro 5-10 mg qhs, reassured this is not an addictive medication- but must be taken daily to work properly.    Influenza vaccine administered today.   Return in about 5 months (around 01/22/2022) for [facility] (30 min).   Orders Placed This Encounter  Procedures   Flu Vaccine QUAD 6+ mos PF IM (Fluarix Quad PF)   POCT HgB A1C    No orders of the defined types were placed in this encounter.  Referral Orders  No referral(s) requested today     Electronically signed by: Howard Pouch, [place]

## 2021-08-15 DIAGNOSIS — R928 Other abnormal and inconclusive findings on diagnostic imaging of breast: Secondary | ICD-10-CM | POA: Diagnosis not present

## 2021-08-15 LAB — HM MAMMOGRAPHY

## 2022-01-23 ENCOUNTER — Other Ambulatory Visit: Payer: Self-pay

## 2022-01-23 ENCOUNTER — Encounter: Payer: Self-pay | Admitting: Family Medicine

## 2022-01-23 ENCOUNTER — Ambulatory Visit (INDEPENDENT_AMBULATORY_CARE_PROVIDER_SITE_OTHER): Payer: Medicare HMO | Admitting: Family Medicine

## 2022-01-23 VITALS — BP 117/66 | HR 68 | Temp 98.0°F | Ht 62.0 in | Wt 116.0 lb

## 2022-01-23 DIAGNOSIS — E559 Vitamin D deficiency, unspecified: Secondary | ICD-10-CM

## 2022-01-23 DIAGNOSIS — E038 Other specified hypothyroidism: Secondary | ICD-10-CM

## 2022-01-23 DIAGNOSIS — R7309 Other abnormal glucose: Secondary | ICD-10-CM | POA: Diagnosis not present

## 2022-01-23 DIAGNOSIS — E782 Mixed hyperlipidemia: Secondary | ICD-10-CM

## 2022-01-23 DIAGNOSIS — M8580 Other specified disorders of bone density and structure, unspecified site: Secondary | ICD-10-CM

## 2022-01-23 DIAGNOSIS — I10 Essential (primary) hypertension: Secondary | ICD-10-CM | POA: Diagnosis not present

## 2022-01-23 DIAGNOSIS — E034 Atrophy of thyroid (acquired): Secondary | ICD-10-CM | POA: Diagnosis not present

## 2022-01-23 DIAGNOSIS — Z79899 Other long term (current) drug therapy: Secondary | ICD-10-CM | POA: Diagnosis not present

## 2022-01-23 DIAGNOSIS — K219 Gastro-esophageal reflux disease without esophagitis: Secondary | ICD-10-CM | POA: Diagnosis not present

## 2022-01-23 LAB — CBC
HCT: 38.1 % (ref 36.0–46.0)
Hemoglobin: 12.4 g/dL (ref 12.0–15.0)
MCHC: 32.7 g/dL (ref 30.0–36.0)
MCV: 94.3 fl (ref 78.0–100.0)
Platelets: 319 10*3/uL (ref 150.0–400.0)
RBC: 4.04 Mil/uL (ref 3.87–5.11)
RDW: 12.9 % (ref 11.5–15.5)
WBC: 6.3 10*3/uL (ref 4.0–10.5)

## 2022-01-23 LAB — COMPREHENSIVE METABOLIC PANEL
ALT: 19 U/L (ref 0–35)
AST: 16 U/L (ref 0–37)
Albumin: 4.3 g/dL (ref 3.5–5.2)
Alkaline Phosphatase: 63 U/L (ref 39–117)
BUN: 14 mg/dL (ref 6–23)
CO2: 30 mEq/L (ref 19–32)
Calcium: 9.5 mg/dL (ref 8.4–10.5)
Chloride: 103 mEq/L (ref 96–112)
Creatinine, Ser: 0.73 mg/dL (ref 0.40–1.20)
GFR: 85.02 mL/min (ref >60.00)
Glucose, Bld: 103 mg/dL — ABNORMAL HIGH (ref 70–99)
Potassium: 4.8 mEq/L (ref 3.5–5.1)
Sodium: 136 mEq/L (ref 135–145)
Total Bilirubin: 0.5 mg/dL (ref 0.2–1.2)
Total Protein: 6.5 g/dL (ref 6.0–8.3)

## 2022-01-23 LAB — LIPID PANEL
Cholesterol: 204 mg/dL — ABNORMAL HIGH (ref 0–200)
HDL: 68.3 mg/dL (ref >39.00)
LDL Cholesterol: 125 mg/dL — ABNORMAL HIGH (ref 0–99)
NonHDL: 135.98
Total CHOL/HDL Ratio: 3
Triglycerides: 55 mg/dL (ref 0.0–149.0)
VLDL: 11 mg/dL (ref 0.0–40.0)

## 2022-01-23 LAB — VITAMIN D 25 HYDROXY (VIT D DEFICIENCY, FRACTURES): VITD: 39.64 ng/mL (ref 30.00–100.00)

## 2022-01-23 LAB — TSH: TSH: 1.59 u[IU]/mL (ref 0.35–5.50)

## 2022-01-23 LAB — T4, FREE: Free T4: 1.03 ng/dL (ref 0.60–1.60)

## 2022-01-23 MED ORDER — PANTOPRAZOLE SODIUM 40 MG PO TBEC
40.0000 mg | DELAYED_RELEASE_TABLET | Freq: Every day | ORAL | 3 refills | Status: DC | PRN
Start: 2022-01-23 — End: 2023-02-11

## 2022-01-23 MED ORDER — ROSUVASTATIN CALCIUM 10 MG PO TABS: 10.0000 mg | ORAL_TABLET | Freq: Every day | ORAL | 3 refills | Status: DC

## 2022-01-23 MED ORDER — LISINOPRIL 30 MG PO TABS: 30.0000 mg | ORAL_TABLET | Freq: Every day | ORAL | 1 refills | Status: DC

## 2022-01-23 MED ORDER — LEVOTHYROXINE SODIUM 75 MCG PO TABS: ORAL_TABLET | ORAL | 3 refills | Status: DC

## 2022-01-23 NOTE — Patient Instructions (Signed)
Great to see you today.  I have refilled the medication(s) we provide.   If labs were collected, we will inform you of lab results once received either by echart message or telephone call.   - echart message- for normal results that have been seen by the patient already.   - telephone call: abnormal results or if patient has not viewed results in their echart.  

## 2022-01-23 NOTE — Progress Notes (Signed)
This visit occurred during the SARS-CoV-2 public health emergency.  Safety protocols were in place, including screening questions prior to the visit, additional usage of staff PPE, and extensive cleaning of exam room while observing appropriate contact time as indicated for disinfecting solutions.    Patient ID: Sheila [name], female  DOB: 01/01/1956, 66 y.o.   MRN: [id number] Patient Care Team    Relationship Specialty Notifications Start End  Ma Hillock, DO PCP - General Family Medicine  02/08/17   Ena Dawley, MD Consulting Physician Obstetrics and Gynecology  02/11/17   Irene Shipper, MD Consulting Physician Gastroenterology  02/11/17     Chief Complaint  Patient presents with   Hypertension    [facility] with labs; pt is fasting     Subjective: Sheila [name] is a 67 y.o.  Female  present for [facility] Hypertension/HLD/Statin therapy:  Pt reports compliance  with lisinopril 30 mg Qd and tatin. Patient denies chest pain, shortness of breath, dizziness or lower extremity edema.  Diet: no routine diet and trying to cut as sugar.  Exercise: nothing routine.  RF: HTN, HLD  Hypothyroidism: She reports compliance  with levothyroxine 75 mcg daily in the morning before eating or drinking.  Elevated a1c Last a1c 6.2.> 6.2>6.2>6.3>5.8 with diet and exercise alone. Never needed meds.   GERD/esophagel stricture-stenosis:  Protonix still prn. She feels she is getting better but will still require med on occassions. She states anything with red sauce or spicy, it creates a great deal of heartburn.   Depression screen [facility] 2/9 01/23/2022 05/03/2021 01/24/2021 10/12/2019 08/13/2018  Decreased Interest 0 0 0 0 0  Down, Depressed, Hopeless 0 0 0 0 0  PHQ - 2 Score 0 0 0 0 0   No flowsheet data found.   Immunization History  Administered Date(s) Administered   Fluad Quad(high Dose 65+) 10/12/2019   Influenza,inj,Quad PF,6+ Mos 08/13/2018, 08/10/2021   Influenza,inj,quad, With Preservative  09/25/2019   Zoster Recombinat (Shingrix) 04/08/2017, 06/17/2017    Past Medical History:  Diagnosis Date   Allergy    Anxiousness 04/27/2021   Esophageal stricture    GERD (gastroesophageal reflux disease)    Hiatal hernia    Hyperlipidemia    Hypothyroidism    Allergies  Allergen Reactions   Nsaids    Past Surgical History:  Procedure Laterality Date   APPENDECTOMY  1967   BACK SURGERY     ESOPHAGOGASTRODUODENOSCOPY N/A 02/08/2017   Procedure: ESOPHAGOGASTRODUODENOSCOPY (EGD);  Surgeon: Manus Gunning, MD;  Location: [facility];  Service: Gastroenterology;  Laterality: N/A;   ESOPHAGOGASTRODUODENOSCOPY (EGD) WITH ESOPHAGEAL DILATION  2004   Family History  Problem Relation Age of Onset   Diabetes Mother    Breast cancer Mother    Hyperlipidemia Mother    Cancer Father        colon/brain   Early death Father    Social History   Social History Narrative   Single. 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   Drinks caffeine.   Wears a seatbelt, smoke detector in the home.   Feels safe in her relationships.    Allergies as of 01/23/2022       Reactions   Nsaids         Medication List        Accurate as of January 23, 2022  8:24 AM. If you have any questions, ask your nurse or doctor.          STOP taking these medications  escitalopram 10 MG tablet Commonly known as: Lexapro Stopped by: Howard Pouch, DO       TAKE these medications    cholecalciferol 25 MCG (1000 UNIT) tablet Commonly known as: VITAMIN D3 Take 4,000 Units by mouth daily.   levothyroxine 75 MCG tablet Commonly known as: SYNTHROID TAKE 1 TABLET BY MOUTH EVERY MORNING   lisinopril 30 MG tablet Commonly known as: ZESTRIL Take 1 tablet (30 mg total) by mouth daily.   pantoprazole 40 MG tablet Commonly known as: PROTONIX Take 1 tablet (40 mg total) by mouth daily as needed. What changed:  when to take this reasons to take this Changed by: Howard Pouch, DO    rosuvastatin 10 MG tablet Commonly known as: Crestor Take 1 tablet (10 mg total) by mouth daily.       All past medical history, surgical history, allergies, family history, immunizations andmedications were updated in the EMR today and reviewed under the history and medication portions of their EMR.     No results found for this or any previous visit (from the past 2160 hour(s)).   No results found.  ROS: 14 pt review of systems performed and negative (unless mentioned in an HPI)  Objective: BP 117/66    Pulse 68    Temp 98 F (36.7 C) (Oral)    Ht '5\' 2"'  (1.575 m)    Wt 116 lb (52.6 kg)    LMP 02/13/2005    SpO2 98%    BMI 21.22 kg/m  Physical Exam Vitals and nursing note reviewed.  Constitutional:      General: She is not in acute distress.    Appearance: Normal appearance. She is not ill-appearing, toxic-appearing or diaphoretic.  HENT:     Head: Normocephalic and atraumatic.  Eyes:     General: No scleral icterus.       Right eye: No discharge.        Left eye: No discharge.     Extraocular Movements: Extraocular movements intact.     Conjunctiva/sclera: Conjunctivae normal.     Pupils: Pupils are equal, round, and reactive to light.  Cardiovascular:     Rate and Rhythm: Normal rate and regular rhythm.     Heart sounds: No murmur heard.   No friction rub.  Pulmonary:     Effort: Pulmonary effort is normal. No respiratory distress.     Breath sounds: Normal breath sounds. No wheezing, rhonchi or rales.  Musculoskeletal:     Cervical back: Neck supple. No tenderness.     Right lower leg: No edema.     Left lower leg: No edema.  Lymphadenopathy:     Cervical: No cervical adenopathy.  Skin:    General: Skin is warm and dry.     Coloration: Skin is not jaundiced or pale.     Findings: No erythema or rash.  Neurological:     Mental Status: She is alert and oriented to person, place, and time. Mental status is at baseline.     Motor: No weakness.     Gait: Gait  normal.  Psychiatric:        Mood and Affect: Mood normal.        Behavior: Behavior normal.        Thought Content: Thought content normal.        Judgment: Judgment normal.     No results found.  Assessment/plan: Sheila [name] is a 66 y.o. female present for [facility] Essential hypertension/HLD/on statin therapy -stable -  goal is less than 130/80. - continue lisinopril to 30 mg QD - continue crestor 10 mg qd.  - low sodium diet. -Routine exercise encouraged. Cbc, cmp, tsh, lipid collected - f/u 5.5 mos.   Hypothyroidism: -continue 75 mcg levothyroxine Qd - tsh, t4 free collected today  Elevated a1c: -Improved last visit. - A1c was 6.6 --> 5.4>>6.2> 6.1>6.2> 6.2>6.3 > 5.9 > a1c collected today - never needed med- changed diet and was able to bring down a1c.  GERD:  Stable. Continue Protonix as needed and Pepcid as needed.  Vitamin D/osteopenia: Supplementing Vit D 4000u.  Vit d > normal 04/27/2021 (40)    Return in about 24 weeks (around 07/10/2022) for [facility] (30 min).   Orders Placed This Encounter  Procedures   CBC   Comp Met (CMET)   Lipid panel   TSH   T4, free   Vitamin D (25 hydroxy)    Meds ordered this encounter  Medications   levothyroxine (SYNTHROID) 75 MCG tablet    Sig: TAKE 1 TABLET BY MOUTH EVERY MORNING    Dispense:  90 tablet    Refill:  3   lisinopril (ZESTRIL) 30 MG tablet    Sig: Take 1 tablet (30 mg total) by mouth daily.    Dispense:  90 tablet    Refill:  1   rosuvastatin (CRESTOR) 10 MG tablet    Sig: Take 1 tablet (10 mg total) by mouth daily.    Dispense:  90 tablet    Refill:  3   pantoprazole (PROTONIX) 40 MG tablet    Sig: Take 1 tablet (40 mg total) by mouth daily as needed.    Dispense:  90 tablet    Refill:  3    Referral Orders  No referral(s) requested today     Electronically signed by: Howard Pouch, [place]

## 2022-05-03 DIAGNOSIS — X32XXXD Exposure to sunlight, subsequent encounter: Secondary | ICD-10-CM | POA: Diagnosis not present

## 2022-05-03 DIAGNOSIS — L570 Actinic keratosis: Secondary | ICD-10-CM | POA: Diagnosis not present

## 2022-05-03 DIAGNOSIS — L718 Other rosacea: Secondary | ICD-10-CM | POA: Diagnosis not present

## 2022-05-08 ENCOUNTER — Telehealth: Payer: Self-pay | Admitting: Family Medicine

## 2022-05-08 NOTE — Telephone Encounter (Signed)
Called 05/08/22 to r/s AWV.  No voicemail setup.   Changed AWV appt to 05/16/22 '@315pm'$ .  Please confirm appt or we can r/[id number]-khc

## 2022-05-09 ENCOUNTER — Ambulatory Visit: Payer: Medicare HMO

## 2022-05-16 ENCOUNTER — Ambulatory Visit: Payer: Medicare HMO

## 2022-05-17 ENCOUNTER — Telehealth: Payer: Self-pay

## 2022-05-17 NOTE — Telephone Encounter (Signed)
Called pt to schedule AWV. Please schedule with health coach, Toria or Willard Madrigal. ? ?

## 2022-06-12 NOTE — Telephone Encounter (Signed)
Patient returning call from yesterday.  I told her it was to set up her AWV, she said I have already cancelled this back in May, I am not interested.  Per patient request to mark her chart - do not call to set up AWV.

## 2022-07-05 DIAGNOSIS — X32XXXD Exposure to sunlight, subsequent encounter: Secondary | ICD-10-CM | POA: Diagnosis not present

## 2022-07-05 DIAGNOSIS — L570 Actinic keratosis: Secondary | ICD-10-CM | POA: Diagnosis not present

## 2022-07-10 ENCOUNTER — Telehealth: Payer: Self-pay | Admitting: Family Medicine

## 2022-07-10 ENCOUNTER — Ambulatory Visit (INDEPENDENT_AMBULATORY_CARE_PROVIDER_SITE_OTHER): Payer: Medicare HMO | Admitting: Family Medicine

## 2022-07-10 ENCOUNTER — Encounter: Payer: Self-pay | Admitting: Family Medicine

## 2022-07-10 VITALS — BP 114/70 | HR 74 | Temp 97.5°F | Ht 62.0 in | Wt 119.0 lb

## 2022-07-10 DIAGNOSIS — R7309 Other abnormal glucose: Secondary | ICD-10-CM | POA: Diagnosis not present

## 2022-07-10 DIAGNOSIS — E782 Mixed hyperlipidemia: Secondary | ICD-10-CM | POA: Diagnosis not present

## 2022-07-10 DIAGNOSIS — I10 Essential (primary) hypertension: Secondary | ICD-10-CM | POA: Diagnosis not present

## 2022-07-10 DIAGNOSIS — E034 Atrophy of thyroid (acquired): Secondary | ICD-10-CM

## 2022-07-10 DIAGNOSIS — K219 Gastro-esophageal reflux disease without esophagitis: Secondary | ICD-10-CM

## 2022-07-10 LAB — POCT GLYCOSYLATED HEMOGLOBIN (HGB A1C)
HbA1c POC (<> result, manual entry): 5.7 % (ref 4.0–5.6)
HbA1c, POC (controlled diabetic range): 5.7 % (ref 0.0–7.0)
HbA1c, POC (prediabetic range): 5.7 % (ref 5.7–6.4)
Hemoglobin A1C: 5.7 % — AB (ref 4.0–5.6)

## 2022-07-10 MED ORDER — LISINOPRIL 30 MG PO TABS: 30.0000 mg | ORAL_TABLET | Freq: Every day | ORAL | 1 refills | Status: DC

## 2022-07-10 NOTE — Patient Instructions (Signed)
Return in about 24 weeks (around 12/25/2022) for cpe (20 min), Routine chronic condition follow-up. Fasting labs will be collected that visit and preventative screening/immunizations offered.      Great to see you today.  I have refilled the medication(s) we provide.   If labs were collected, we will inform you of lab results once received either by echart message or telephone call.   - echart message- for normal results that have been seen by the patient already.   - telephone call: abnormal results or if patient has not viewed results in their echart.

## 2022-07-10 NOTE — Progress Notes (Signed)
Patient ID: Sheila [name], female  DOB: [name] 28, 1957, 66 y.o.   MRN: [id number] Patient Care Team    Relationship Specialty Notifications Start End  Ma Hillock, DO PCP - General Family Medicine  02/08/17   Ena Dawley, MD Consulting Physician Obstetrics and Gynecology  02/11/17   Irene Shipper, MD Consulting Physician Gastroenterology  02/11/17     Chief Complaint  Patient presents with   Hypertension    [facility]; pt is fasting     Subjective: Sheila [name] is a 66 y.o.  Female  present for [facility] Hypertension/HLD/Statin therapy:  Pt reports compliance  with lisinopril 30 mg Qd and tatin. Patient denies chest pain, shortness of breath, dizziness or lower extremity edema.  Diet: no routine diet and trying to cut as sugar.  Exercise: nothing routine.  RF: HTN, HLD  Hypothyroidism: She reports compliance with levothyroxine 75 mcg daily in the morning before eating or drinking.  Elevated a1c Last a1c 6.2.> 6.2>6.2>6.3>5.8 with diet and exercise alone. Never has required medications for DM  GERD/esophagel stricture-stenosis:  Protonix  use prn. She feels she is getting better but will still require med on occassions. She states anything with red sauce or spicy, it creates a great deal of heartburn.      01/23/2022    8:07 AM 05/03/2021    3:54 PM 01/24/2021    8:43 AM 10/12/2019    8:41 AM 08/13/2018   11:53 AM  Depression screen PHQ 2/9  Decreased Interest 0 0 0 0 0  Down, Depressed, Hopeless 0 0 0 0 0  PHQ - 2 Score 0 0 0 0 0       No data to display           Immunization History  Administered Date(s) Administered   Fluad Quad(high Dose 65+) 10/12/2019   Influenza,inj,Quad PF,6+ Mos 08/13/2018, 08/10/2021   Influenza,inj,quad, With Preservative 09/25/2019   Zoster Recombinat (Shingrix) 04/08/2017, 06/17/2017    Past Medical History:  Diagnosis Date   Allergy    Anxiousness 04/27/2021   Esophageal stricture    GERD (gastroesophageal reflux disease)     Hiatal hernia    Hyperlipidemia    Hypothyroidism    Allergies  Allergen Reactions   Nsaids    Past Surgical History:  Procedure Laterality Date   APPENDECTOMY  1967   BACK SURGERY     ESOPHAGOGASTRODUODENOSCOPY N/A 02/08/2017   Procedure: ESOPHAGOGASTRODUODENOSCOPY (EGD);  Surgeon: Manus Gunning, MD;  Location: [facility];  Service: Gastroenterology;  Laterality: N/A;   ESOPHAGOGASTRODUODENOSCOPY (EGD) WITH ESOPHAGEAL DILATION  2004   Family History  Problem Relation Age of Onset   Diabetes Mother    Breast cancer Mother    Hyperlipidemia Mother    Cancer Father        colon/brain   Early death Father    Social History   Social History Narrative   Single. 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   Drinks caffeine.   Wears a seatbelt, smoke detector in the home.   Feels safe in her relationships.    Allergies as of 07/10/2022       Reactions   Nsaids         Medication List        Accurate as of [name] 25, 2023  9:27 AM. If you have any questions, ask your nurse or doctor.          cholecalciferol 25 MCG (1000 UNIT) tablet Commonly known  as: VITAMIN D3 Take 4,000 Units by mouth daily.   levothyroxine 75 MCG tablet Commonly known as: SYNTHROID TAKE 1 TABLET BY MOUTH EVERY MORNING   lisinopril 30 MG tablet Commonly known as: ZESTRIL Take 1 tablet (30 mg total) by mouth daily.   pantoprazole 40 MG tablet Commonly known as: PROTONIX Take 1 tablet (40 mg total) by mouth daily as needed.   rosuvastatin 10 MG tablet Commonly known as: Crestor Take 1 tablet (10 mg total) by mouth daily.       All past medical history, surgical history, allergies, family history, immunizations andmedications were updated in the EMR today and reviewed under the history and medication portions of their EMR.     No results found for this or any previous visit (from the past 2160 hour(s)).   No results found.  ROS: 14 pt review of systems performed and  negative (unless mentioned in an HPI)  Objective: BP 114/70   Pulse 74   Temp (!) 97.5 F (36.4 C) (Oral)   Ht '5\' 2"'$  (1.575 m)   Wt 119 lb (54 kg)   LMP 02/13/2005   SpO2 100%   BMI 21.77 kg/m  Physical Exam Vitals and nursing note reviewed.  Constitutional:      General: She is not in acute distress.    Appearance: Normal appearance. She is not ill-appearing, toxic-appearing or diaphoretic.  HENT:     Head: Normocephalic and atraumatic.  Eyes:     General: No scleral icterus.       Right eye: No discharge.        Left eye: No discharge.     Extraocular Movements: Extraocular movements intact.     Conjunctiva/sclera: Conjunctivae normal.     Pupils: Pupils are equal, round, and reactive to light.  Cardiovascular:     Rate and Rhythm: Normal rate and regular rhythm.     Heart sounds: No murmur heard.    No friction rub.  Pulmonary:     Effort: Pulmonary effort is normal. No respiratory distress.     Breath sounds: Normal breath sounds. No wheezing, rhonchi or rales.  Musculoskeletal:     Right lower leg: No edema.     Left lower leg: No edema.  Skin:    General: Skin is warm and dry.     Coloration: Skin is not pale.     Findings: No rash.  Neurological:     Mental Status: She is alert and oriented to person, place, and time. Mental status is at baseline.     Motor: No weakness.     Gait: Gait normal.  Psychiatric:        Mood and Affect: Mood normal.        Behavior: Behavior normal.        Thought Content: Thought content normal.        Judgment: Judgment normal.      No results found for this or any previous visit (from the past 48 hour(s)).   Assessment/plan: Sheila [name] is a 66 y.o. female present for [facility] Essential hypertension/HLD/on statin therapy -stable -  goal is less than 130/80. -Continue lisinopril to 30 mg QD -Continue crestor 10 mg qd.  - low sodium diet. -Routine exercise encouraged. Labs up-to-date 01/2022 - f/u 5.5 mos.    Hypothyroidism: -Continue 75 mcg levothyroxine Qd - labs UTD 01/2022  Elevated a1c: -Improved last visit. - A1c was 6.6 --> 5.4>>6.2> 6.1>6.2> 6.2>6.3 > 5.9 > 5.7 a1c collected today -Patient  has never required medication treatment for her elevated A1c she had changed diet and was able to bring down a1c.  GERD:  Stable Continue Protonix as needed and Pepcid as needed.  Vitamin D/osteopenia: Supplementing Vit D 4000u.  Vit d lab up-to-date 01/2022 monitor yearly.    Return in about 24 weeks (around 12/25/2022) for cpe (20 min), Routine chronic condition follow-up.   Orders Placed This Encounter  Procedures   POCT HgB A1C    Meds ordered this encounter  Medications   lisinopril (ZESTRIL) 30 MG tablet    Sig: Take 1 tablet (30 mg total) by mouth daily.    Dispense:  90 tablet    Refill:  1    Referral Orders  No referral(s) requested today     Electronically signed by: Howard Pouch, [place]

## 2022-07-10 NOTE — Telephone Encounter (Signed)
Spoke with patient she state AWV was sched 7/26 with CMA, I did not see appt on schedule

## 2022-08-31 ENCOUNTER — Telehealth: Payer: Self-pay

## 2022-08-31 NOTE — Telephone Encounter (Signed)
Called patient to schedule medicare well visit.  Voicemail has not been set up on her phone [phone]).  It was also noted on 06/06/22 that she is not interested in scheduling AWV, and to not contact her to schedule. (I did not see this note prior to calling)  I have added note on appt desk for future calls.

## 2022-11-01 LAB — FECAL OCCULT BLOOD, IMMUNOCHEMICAL: IFOBT: NEGATIVE

## 2022-12-25 ENCOUNTER — Encounter: Payer: Medicare HMO | Admitting: Family Medicine

## 2023-01-15 ENCOUNTER — Ambulatory Visit (INDEPENDENT_AMBULATORY_CARE_PROVIDER_SITE_OTHER): Payer: Medicare HMO | Admitting: Family Medicine

## 2023-01-15 ENCOUNTER — Ambulatory Visit (INDEPENDENT_AMBULATORY_CARE_PROVIDER_SITE_OTHER): Payer: Medicare HMO

## 2023-01-15 ENCOUNTER — Encounter: Payer: Self-pay | Admitting: Family Medicine

## 2023-01-15 ENCOUNTER — Other Ambulatory Visit: Payer: Self-pay | Admitting: Family Medicine

## 2023-01-15 VITALS — BP 129/73 | HR 84 | Temp 97.7°F | Wt 124.0 lb

## 2023-01-15 DIAGNOSIS — M5136 Other intervertebral disc degeneration, lumbar region: Secondary | ICD-10-CM | POA: Diagnosis not present

## 2023-01-15 DIAGNOSIS — M533 Sacrococcygeal disorders, not elsewhere classified: Secondary | ICD-10-CM

## 2023-01-15 DIAGNOSIS — M5416 Radiculopathy, lumbar region: Secondary | ICD-10-CM

## 2023-01-15 DIAGNOSIS — M51369 Other intervertebral disc degeneration, lumbar region without mention of lumbar back pain or lower extremity pain: Secondary | ICD-10-CM

## 2023-01-15 DIAGNOSIS — Z0000 Encounter for general adult medical examination without abnormal findings: Secondary | ICD-10-CM | POA: Diagnosis not present

## 2023-01-15 MED ORDER — METHYLPREDNISOLONE ACETATE 80 MG/ML IJ SUSP
80.0000 mg | Freq: Once | INTRAMUSCULAR | Status: AC
  Administered 2023-01-15: 80 mg via INTRAMUSCULAR

## 2023-01-15 MED ORDER — CYCLOBENZAPRINE HCL 5 MG PO TABS: 5.0000 mg | ORAL_TABLET | Freq: Every day | ORAL | 0 refills | Status: DC

## 2023-01-15 NOTE — Patient Instructions (Signed)
No follow-ups on file.        Great to see you today.  I have refilled the medication(s) we provide.   If labs were collected, we will inform you of lab results once received either by echart message or telephone call.   - echart message- for normal results that have been seen by the patient already.   - telephone call: abnormal results or if patient has not viewed results in their echart.  

## 2023-01-15 NOTE — Progress Notes (Signed)
Sheila [name] , 1956-08-23, 67 y.o., female MRN: [id number] Patient Care Team    Relationship Specialty Notifications Start End  Ma Hillock, DO PCP - General Family Medicine  02/08/17   Ena Dawley, MD Consulting Physician Obstetrics and Gynecology  02/11/17   Irene Shipper, MD Consulting Physician Gastroenterology  02/11/17   Allyn Kenner, MD  Dermatology  07/10/22     Chief Complaint  Patient presents with   Back Pain    Lower back ache and legs     Subjective: Pt presents for an OV with complaints of lumbar back pain.  Patient reports a few weeks ago she noticed bilateral low back pain.  Pain is worse on the left lower side when she is vacuuming.  She feels like the pain is radiating into her thighs.  She states this sensation really is not pain in her thighs is much as it is discomfort.  She has been taking Tylenol to help with the discomfort.  She has had similar episodes in the past that responded to physical therapy.  She denies any bladder or bowel incontinence.  She denies any weakness.    01/15/2023    2:18 PM 01/23/2022    8:07 AM 05/03/2021    3:54 PM 01/24/2021    8:43 AM 10/12/2019    8:41 AM  Depression screen PHQ 2/9  Decreased Interest 0 0 0 0 0  Down, Depressed, Hopeless 0 0 0 0 0  PHQ - 2 Score 0 0 0 0 0    Allergies  Allergen Reactions   Nsaids    Social History   Social History Narrative   Single. 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   Drinks caffeine.   Wears a seatbelt, smoke detector in the home.   Feels safe in her relationships.   Past Medical History:  Diagnosis Date   Allergy    Anxiousness 04/27/2021   Esophageal stricture    GERD (gastroesophageal reflux disease)    Hiatal hernia    Hyperlipidemia    Hypothyroidism    Past Surgical History:  Procedure Laterality Date   APPENDECTOMY  1967   BACK SURGERY     ESOPHAGOGASTRODUODENOSCOPY N/A 02/08/2017   Procedure: ESOPHAGOGASTRODUODENOSCOPY (EGD);  Surgeon: Manus Gunning, MD;  Location: [facility];  Service: Gastroenterology;  Laterality: N/A;   ESOPHAGOGASTRODUODENOSCOPY (EGD) WITH ESOPHAGEAL DILATION  2004   Family History  Problem Relation Age of Onset   Diabetes Mother    Breast cancer Mother    Hyperlipidemia Mother    Cancer Father        colon/brain   Early death Father    Allergies as of 01/15/2023       Reactions   Nsaids         Medication List        Accurate as of January 15, 2023  2:49 PM. If you have any questions, ask your nurse or doctor.          cholecalciferol 25 MCG (1000 UNIT) tablet Commonly known as: VITAMIN D3 Take 4,000 Units by mouth daily.   cyclobenzaprine 5 MG tablet Commonly known as: FLEXERIL Take 1 tablet (5 mg total) by mouth at bedtime. Started by: Howard Pouch, DO   levothyroxine 75 MCG tablet Commonly known as: SYNTHROID TAKE 1 TABLET BY MOUTH EVERY MORNING   lisinopril 30 MG tablet Commonly known as: ZESTRIL Take 1 tablet (30 mg total) by mouth daily.   pantoprazole  40 MG tablet Commonly known as: PROTONIX Take 1 tablet (40 mg total) by mouth daily as needed.   rosuvastatin 10 MG tablet Commonly known as: Crestor Take 1 tablet (10 mg total) by mouth daily.        All past medical history, surgical history, allergies, family history, immunizations andmedications were updated in the EMR today and reviewed under the history and medication portions of their EMR.     ROS Negative, with the exception of above mentioned in HPI   Objective:  BP 129/73   Pulse 84   Temp 97.7 F (36.5 C)   Wt 124 lb (56.2 kg)   LMP 02/13/2005   SpO2 97%   BMI 22.68 kg/m  Body mass index is 22.68 kg/m. Physical Exam Vitals and nursing note reviewed.  Constitutional:      General: She is not in acute distress.    Appearance: Normal appearance. She is normal weight. She is not ill-appearing or toxic-appearing.  HENT:     Head: Normocephalic and atraumatic.  Eyes:     General: No  scleral icterus.       Right eye: No discharge.        Left eye: No discharge.     Extraocular Movements: Extraocular movements intact.     Conjunctiva/sclera: Conjunctivae normal.     Pupils: Pupils are equal, round, and reactive to light.  Musculoskeletal:     Lumbar back: Spasms and tenderness present. No swelling or bony tenderness. Normal range of motion. Negative right straight leg raise test and negative left straight leg raise test.     Comments: Lumbar spine: Erythema.  No bony tenderness.  Tender to palpation left SI joint.  Right lumbar paraspinal muscle tightness present.  MS 5/5 b/l LE.  Negative SLR.  FABRE difficult- she has extremely limited ROM b/l hip external rotation (chronic). NV intact distally.  Skin:    Findings: No rash.  Neurological:     Mental Status: She is alert and oriented to person, place, and time. Mental status is at baseline.     Motor: No weakness.     Coordination: Coordination normal.     Gait: Gait normal.  Psychiatric:        Mood and Affect: Mood normal.        Behavior: Behavior normal.        Thought Content: Thought content normal.        Judgment: Judgment normal.      No results found. No results found. No results found for this or any previous visit (from the past 24 hour(s)).  Assessment/Plan: [name] is a 67 y.o. female present for OV for  Lumbar radiculopathy/Degeneration of lumbar intervertebral disc/Sacroiliac joint dysfunction of left side Rest.  Heat application. IM Depo-Medrol injection provided today. Flexeril 5 mg nightly as needed Encouraged her to start the stretches/PT instruction she has from prior for same condition - methylPREDNISolone acetate (DEPO-MEDROL) injection 80 mg She has an appointment in a couple weeks for her CPE, we will recheck her at that time and considered further imaging if necessary.   Reviewed expectations re: course of current medical issues. Discussed self-management of  symptoms. Outlined signs and symptoms indicating need for more acute intervention. Patient verbalized understanding and all questions were answered. Patient received an After-Visit Summary.  +25 medicare annual Wellness completed by health coach.   No orders of the defined types were placed in this encounter.  Meds ordered this encounter  Medications  cyclobenzaprine (FLEXERIL) 5 MG tablet    Sig: Take 1 tablet (5 mg total) by mouth at bedtime.    Dispense:  30 tablet    Refill:  0   methylPREDNISolone acetate (DEPO-MEDROL) injection 80 mg   Referral Orders  No referral(s) requested today     Note is dictated utilizing voice recognition software. Although note has been proof read prior to signing, occasional typographical errors still can be missed. If any questions arise, please do not hesitate to call for verification.   electronically signed by:  Howard Pouch, DO  [place]

## 2023-01-15 NOTE — Progress Notes (Signed)
Subjective:   Sheila [name] is a 67 y.o. female who presents for Medicare Annual (Subsequent) preventive examination.  Review of Systems    Defer to PCP Cardiac Risk Factors include: advanced age (>15mn, >>23women);dyslipidemia;hypertension     Objective:    Today's Vitals   01/15/23 1407  PainSc: 1    There is no height or weight on file to calculate BMI.     01/15/2023    2:06 PM 05/03/2021    3:51 PM 02/08/2017    9:11 PM  Advanced Directives  Does Patient Have a Medical Advance Directive? No No No  Would patient like information on creating a medical advance directive? No - Patient declined No - Patient declined No - Patient declined    Current Medications (verified) Outpatient Encounter Medications as of 01/15/2023  Medication Sig   cholecalciferol (VITAMIN D3) 25 MCG (1000 UNIT) tablet Take 4,000 Units by mouth daily.   levothyroxine (SYNTHROID) 75 MCG tablet TAKE 1 TABLET BY MOUTH EVERY MORNING   lisinopril (ZESTRIL) 30 MG tablet Take 1 tablet (30 mg total) by mouth daily.   pantoprazole (PROTONIX) 40 MG tablet Take 1 tablet (40 mg total) by mouth daily as needed.   rosuvastatin (CRESTOR) 10 MG tablet Take 1 tablet (10 mg total) by mouth daily.   No facility-administered encounter medications on file as of 01/15/2023.    Allergies (verified) Nsaids   History: Past Medical History:  Diagnosis Date   Allergy    Anxiousness 04/27/2021   Esophageal stricture    GERD (gastroesophageal reflux disease)    Hiatal hernia    Hyperlipidemia    Hypothyroidism    Past Surgical History:  Procedure Laterality Date   APPENDECTOMY  1967   BACK SURGERY     ESOPHAGOGASTRODUODENOSCOPY N/A 02/08/2017   Procedure: ESOPHAGOGASTRODUODENOSCOPY (EGD);  Surgeon: SManus Gunning MD;  [facility];  Service: Gastroenterology;  Laterality: N/A;   ESOPHAGOGASTRODUODENOSCOPY (EGD) WITH ESOPHAGEAL DILATION  2004   Family History  Problem Relation Age of Onset    Diabetes Mother    Breast cancer Mother    Hyperlipidemia Mother    Cancer Father        colon/brain   Early death Father    Social History   Socioeconomic History   Marital status: Divorced    Spouse name: Not on file   Number of children: Not on file   Years of education: 12   Highest education level: Not on file  Occupational History   Occupation: birdery  Tobacco Use   Smoking status: Never   Smokeless tobacco: Never  Vaping Use   Vaping Use: Never used  Substance and Sexual Activity   Alcohol use: No   Drug use: No   Sexual activity: Yes    Partners: Male    Birth control/protection: None  Other Topics Concern   Not on file  Social History Narrative   Single. 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   Drinks caffeine.   Wears a seatbelt, smoke detector in the home.   Feels safe in her relationships.   Social Determinants of Health   Financial Resource Strain: Low Risk  (01/15/2023)   Overall Financial Resource Strain (CARDIA)    Difficulty of Paying Living Expenses: Not hard at all  Food Insecurity: No Food Insecurity (01/15/2023)   Hunger Vital Sign    Worried About Running Out of Food in the Last Year: Never true    Ran Out of Food in the  Last Year: Never true  Transportation Needs: No Transportation Needs (01/15/2023)   PRAPARE - Hydrologist (Medical): No    Lack of Transportation (Non-Medical): No  Physical Activity: Insufficiently Active (01/15/2023)   Exercise Vital Sign    Days of Exercise per Week: 2 days    Minutes of Exercise per Session: 30 min  Stress: No Stress Concern Present (01/15/2023)   [place]    Feeling of Stress : Only a little  Social Connections: Moderately Isolated (01/15/2023)   Social Connection and Isolation Panel [NHANES]    Frequency of Communication with Friends and Family: More than three times a week    Frequency of Social  Gatherings with Friends and Family: More than three times a week    Attends Religious Services: Never    Marine scientist or Organizations: No    Attends Music therapist: Never    Marital Status: Living with partner    Tobacco Counseling Counseling given: Not Answered   Clinical Intake:  Pre-visit preparation completed: No  Pain Score: 1  Pain Type: Acute pain Pain Location: Back Pain Descriptors / Indicators: Aching     BMI - recorded: 22 Nutritional Status: BMI of 19-24  Normal Nutritional Risks: None Diabetes: No  How often do you need to have someone help you when you read instructions, pamphlets, or other written materials from your doctor or pharmacy?: 1 - Never What is the last grade level you completed in school?: 11th  Diabetic?No  Interpreter Needed?: No  Information entered by :: Toria C., RMA   Activities of Daily Living    01/15/2023    2:05 PM  In your present state of health, do you have any difficulty performing the following activities:  Hearing? 0  Vision? 0  Difficulty concentrating or making decisions? 0  Walking or climbing stairs? 0  Dressing or bathing? 0  Doing errands, shopping? 0  Preparing Food and eating ? N  Using the Toilet? N  In the past six months, have you accidently leaked urine? N  Do you have problems with loss of bowel control? N  Managing your Medications? N  Managing your Finances? N  Housekeeping or managing your Housekeeping? N    Patient Care Team: Ma Hillock, DO as PCP - General (Family Medicine) Ena Dawley, MD as Consulting Physician (Obstetrics and Gynecology) Irene Shipper, MD as Consulting Physician (Gastroenterology) Allyn Kenner, MD (Dermatology)  Indicate any recent Medical Services you may have received from other than [facility] providers in the past year (date may be approximate).     Assessment:   This is a routine wellness examination for [place].  Hearing/Vision  screen No results found.  Dietary issues and exercise activities discussed:     Goals Addressed   None   Depression Screen    01/15/2023    2:18 PM 01/23/2022    8:07 AM 05/03/2021    3:54 PM 01/24/2021    8:43 AM 10/12/2019    8:41 AM 08/13/2018   11:53 AM 04/28/2018    9:06 AM  PHQ 2/9 Scores  PHQ - 2 Score 0 0 0 0 0 0 0    Fall Risk    01/15/2023    2:11 PM 01/23/2022    8:07 AM 05/03/2021    3:53 PM 01/24/2021    8:43 AM 08/13/2018   11:53 AM  Fall Risk   Falls in the  past year? 0 0 0 0 No  Number falls in past yr: 0 0 0 0   Injury with Fall? 0 0 0 0   Risk for fall due to : No Fall Risks No Fall Risks     Follow up  Falls evaluation completed Falls prevention discussed Falls evaluation completed     [place]:  Any stairs in or around the home? No  If so, are there any without handrails? No  Home free of loose throw rugs in walkways, pet beds, electrical cords, etc? Yes  Adequate lighting in your home to reduce risk of falls? Yes   ASSISTIVE DEVICES UTILIZED TO PREVENT FALLS:  Life alert? No  Use of a cane, walker or w/c? No  Grab bars in the bathroom? No  Shower chair or bench in shower? No  Elevated toilet seat or a handicapped toilet? No   TIMED UP AND GO:  Was the test performed? Yes .  Length of time to ambulate 10 feet: 6 sec.   Gait steady and fast without use of assistive device  Cognitive Function:        01/15/2023    2:09 PM  6CIT Screen  What Year? 0 points  What month? 0 points  What time? 0 points  Count back from 20 0 points  Months in reverse 0 points  Repeat phrase 0 points  Total Score 0 points    Immunizations Immunization History  Administered Date(s) Administered   Fluad Quad(high Dose 65+) 10/12/2019   Influenza,inj,Quad PF,6+ Mos 08/13/2018, 08/10/2021   Influenza,inj,quad, With Preservative 09/25/2019   Zoster Recombinat (Shingrix) 04/08/2017, 06/17/2017    TDAP status: Due, Education  has been provided regarding the importance of this vaccine. Advised may receive this vaccine at local pharmacy or Health Dept. Aware to provide a copy of the vaccination record if obtained from local pharmacy or Health Dept. Verbalized acceptance and understanding.  Flu Vaccine status: Declined, Education has been provided regarding the importance of this vaccine but patient still declined. Advised may receive this vaccine at local pharmacy or Health Dept. Aware to provide a copy of the vaccination record if obtained from local pharmacy or Health Dept. Verbalized acceptance and understanding.  Pneumococcal vaccine status: Declined,  Education has been provided regarding the importance of this vaccine but patient still declined. Advised may receive this vaccine at local pharmacy or Health Dept. Aware to provide a copy of the vaccination record if obtained from local pharmacy or Health Dept. Verbalized acceptance and understanding.   Covid-19 vaccine status: Information provided on how to obtain vaccines.   Qualifies for Shingles Vaccine? Yes   Zostavax completed No   Shingrix Completed?: Yes  Screening Tests Health Maintenance  Topic Date Due   DTaP/Tdap/Td (1 - Tdap) Never done   Pneumonia Vaccine 14+ Years old (1 - PCV) 01/23/2023 (Originally 07/05/2019)   INFLUENZA VACCINE  03/17/2023 (Originally 07/17/2022)   MAMMOGRAM  08/16/2023   COLON CANCER SCREENING ANNUAL FOBT  11/13/2023   Medicare Annual Wellness (AWV)  01/16/2024   Hepatitis C Screening  Completed   Zoster Vaccines- Shingrix  Completed   HPV VACCINES  Aged Out   DEXA SCAN  Discontinued   COLONOSCOPY (Pts 45-60yr Insurance coverage will need to be confirmed)  Discontinued   COVID-19 Vaccine  Discontinued    Health Maintenance  Health Maintenance Due  Topic Date Due   DTaP/Tdap/Td (1 - Tdap) Never done    Colorectal cancer screening:  Type of screening: FOBT/FIT. Completed 11/12/22. Repeat every 1 years  Mammogram  status: Ordered by [facility]. Pt provided with contact info and advised to call to schedule appt.   Bone Density status: Completed 06/29/21. Results reflect: Bone density results: NORMAL. Repeat every 2 years.  Lung Cancer Screening: (Low Dose CT Chest recommended if Age 35-80 years, 30 pack-year currently smoking OR have quit w/in 15years.) does not qualify.    Additional Screening:  Hepatitis C Screening: does qualify; Completed 04/08/17  Vision Screening: Recommended annual ophthalmology exams for early detection of glaucoma and other disorders of the eye. Is the patient up to date with their annual eye exam?  No  If pt is not established with a provider, would they like to be referred to a provider to establish care? No .   Dental Screening: Recommended annual dental exams for proper oral hygiene  Community Resource Referral / Chronic Care Management: CRR required this visit?  No   CCM required this visit?  No      Plan:     I have personally reviewed and noted the following in the patient's chart:   Medical and social history Use of alcohol, tobacco or illicit drugs  Current medications and supplements including opioid prescriptions. Patient is not currently taking opioid prescriptions. Functional ability and status Nutritional status Physical activity Advanced directives List of other physicians Hospitalizations, surgeries, and ER visits in previous 12 months Vitals Screenings to include cognitive, depression, and falls Referrals and appointments  In addition, I have reviewed and discussed with patient certain preventive protocols, quality metrics, and best practice recommendations. A written personalized care plan for preventive services as well as general preventive health recommendations were provided to patient.     Kavin Leech, [place]   01/15/2023   Nurse Notes: face to face 15 minutes.  Ms. Attaway , Thank you for taking time to come for your Medicare Wellness  Visit. I appreciate your ongoing commitment to your health goals. Please review the following plan we discussed and let me know if I can assist you in the future.   These are the goals we discussed:  Goals      Patient Stated     Eat healthier & increase activity        This is a list of the screening recommended for you and due dates:  Health Maintenance  Topic Date Due   DTaP/Tdap/Td vaccine (1 - Tdap) Never done   Pneumonia Vaccine (1 - PCV) 01/23/2023*   Flu Shot  03/17/2023*   Mammogram  08/16/2023   Stool Blood Test  11/13/2023   Medicare Annual Wellness Visit  01/16/2024   Hepatitis C Screening: USPSTF Recommendation to screen - Ages 18-79 yo.  Completed   Zoster (Shingles) Vaccine  Completed   HPV Vaccine  Aged Out   DEXA scan (bone density measurement)  Discontinued   Colon Cancer Screening  Discontinued   COVID-19 Vaccine  Discontinued  *Topic was postponed. The date shown is not the original due date.

## 2023-02-07 ENCOUNTER — Other Ambulatory Visit: Payer: Self-pay | Admitting: Family Medicine

## 2023-02-11 ENCOUNTER — Encounter: Payer: Self-pay | Admitting: Family Medicine

## 2023-02-11 ENCOUNTER — Ambulatory Visit (INDEPENDENT_AMBULATORY_CARE_PROVIDER_SITE_OTHER): Payer: Medicare HMO | Admitting: Family Medicine

## 2023-02-11 VITALS — BP 131/72 | HR 90 | Temp 98.0°F | Ht 62.0 in | Wt 123.0 lb

## 2023-02-11 DIAGNOSIS — M8580 Other specified disorders of bone density and structure, unspecified site: Secondary | ICD-10-CM | POA: Diagnosis not present

## 2023-02-11 DIAGNOSIS — Z1211 Encounter for screening for malignant neoplasm of colon: Secondary | ICD-10-CM

## 2023-02-11 DIAGNOSIS — Z0000 Encounter for general adult medical examination without abnormal findings: Secondary | ICD-10-CM

## 2023-02-11 DIAGNOSIS — E559 Vitamin D deficiency, unspecified: Secondary | ICD-10-CM | POA: Diagnosis not present

## 2023-02-11 DIAGNOSIS — Z2821 Immunization not carried out because of patient refusal: Secondary | ICD-10-CM | POA: Diagnosis not present

## 2023-02-11 DIAGNOSIS — R7309 Other abnormal glucose: Secondary | ICD-10-CM | POA: Diagnosis not present

## 2023-02-11 DIAGNOSIS — Z79899 Other long term (current) drug therapy: Secondary | ICD-10-CM

## 2023-02-11 DIAGNOSIS — E034 Atrophy of thyroid (acquired): Secondary | ICD-10-CM

## 2023-02-11 DIAGNOSIS — E782 Mixed hyperlipidemia: Secondary | ICD-10-CM

## 2023-02-11 DIAGNOSIS — I10 Essential (primary) hypertension: Secondary | ICD-10-CM

## 2023-02-11 DIAGNOSIS — K219 Gastro-esophageal reflux disease without esophagitis: Secondary | ICD-10-CM | POA: Diagnosis not present

## 2023-02-11 LAB — COMPREHENSIVE METABOLIC PANEL
ALT: 17 U/L (ref 0–35)
AST: 16 U/L (ref 0–37)
Albumin: 4.1 g/dL (ref 3.5–5.2)
Alkaline Phosphatase: 72 U/L (ref 39–117)
BUN: 16 mg/dL (ref 6–23)
CO2: 26 mEq/L (ref 19–32)
Calcium: 9.7 mg/dL (ref 8.4–10.5)
Chloride: 102 mEq/L (ref 96–112)
Creatinine, Ser: 0.75 mg/dL (ref 0.40–1.20)
GFR: 81.7 mL/min (ref >60.00)
Glucose, Bld: 119 mg/dL — ABNORMAL HIGH (ref 70–99)
Potassium: 4.4 mEq/L (ref 3.5–5.1)
Sodium: 136 mEq/L (ref 135–145)
Total Bilirubin: 0.4 mg/dL (ref 0.2–1.2)
Total Protein: 6.8 g/dL (ref 6.0–8.3)

## 2023-02-11 LAB — HEMOGLOBIN A1C: Hgb A1c MFr Bld: 6.6 % — ABNORMAL HIGH (ref 4.6–6.5)

## 2023-02-11 LAB — LIPID PANEL
Cholesterol: 208 mg/dL — ABNORMAL HIGH (ref 0–200)
HDL: 74.8 mg/dL (ref >39.00)
LDL Cholesterol: 119 mg/dL — ABNORMAL HIGH (ref 0–99)
NonHDL: 133.19
Total CHOL/HDL Ratio: 3
Triglycerides: 73 mg/dL (ref 0.0–149.0)
VLDL: 14.6 mg/dL (ref 0.0–40.0)

## 2023-02-11 LAB — CBC
HCT: 37.8 % (ref 36.0–46.0)
Hemoglobin: 12.9 g/dL (ref 12.0–15.0)
MCHC: 34 g/dL (ref 30.0–36.0)
MCV: 93 fl (ref 78.0–100.0)
Platelets: 365 10*3/uL (ref 150.0–400.0)
RBC: 4.07 Mil/uL (ref 3.87–5.11)
RDW: 12.8 % (ref 11.5–15.5)
WBC: 6.6 10*3/uL (ref 4.0–10.5)

## 2023-02-11 LAB — VITAMIN D 25 HYDROXY (VIT D DEFICIENCY, FRACTURES): VITD: 38.79 ng/mL (ref 30.00–100.00)

## 2023-02-11 LAB — T4, FREE: Free T4: 1.28 ng/dL (ref 0.60–1.60)

## 2023-02-11 LAB — TSH: TSH: 3.08 u[IU]/mL (ref 0.35–5.50)

## 2023-02-11 MED ORDER — LISINOPRIL 30 MG PO TABS: 30.0000 mg | ORAL_TABLET | Freq: Every day | ORAL | 0 refills | Status: DC

## 2023-02-11 MED ORDER — PANTOPRAZOLE SODIUM 40 MG PO TBEC: 40.0000 mg | DELAYED_RELEASE_TABLET | Freq: Every day | ORAL | 3 refills | Status: DC | PRN

## 2023-02-11 MED ORDER — ROSUVASTATIN CALCIUM 10 MG PO TABS: 10.0000 mg | ORAL_TABLET | Freq: Every day | ORAL | 3 refills | Status: DC

## 2023-02-11 NOTE — Progress Notes (Signed)
Patient ID: Sheila [name], female  DOB: Sep 22, 1956, 67 y.o.   MRN: [id number] Patient Care Team    Relationship Specialty Notifications Start End  Ma Hillock, DO PCP - General Family Medicine  02/08/17   Ena Dawley, MD Consulting Physician Obstetrics and Gynecology  02/11/17   Irene Shipper, MD Consulting Physician Gastroenterology  02/11/17   Allyn Kenner, MD  Dermatology  07/10/22     Chief Complaint  Patient presents with   Annual Exam    Pt is fasting; [place]; has not taken crestor due to reaction     Subjective: Sheila [name] is a 67 y.o.  Female  present for yearly physical with combined routine chronic condition appointment.  Chart has been updated today with any new PMH, surgical history, family history and medication reconciliation.Sheila [name]  Health maintenance:  Colonoscopy: Completed FOBT 10/2022.  Est w/ [place] and desires colonoscopy now. (Referred) Mammogram:  Breast cancer in family. Yearly exams through GYN. Pt reports one in the last year and normal.  Cervical cancer screening: Routine exam through GYN. Immunizations: tdap 2015, Influenza UTD (encouraged yearly), PNA series start at 65-has declined,  Shingrix series completed.  Infectious disease screening: HIV and Hep C completed  DEXA: low vit d, estrogen deficient.  Complete 06/2021-> at Gyn -Osteopenia.  Declines further screening.  Hypertension/HLD/Statin therapy:  Pt reports compliance with lisinopril 30 mg Qd and statin. Patient denies chest pain, shortness of breath, dizziness or lower extremity edema.  Diet: no routine diet and trying to cut as sugar.  Exercise: nothing routine.  RF: HTN, HLD  Hypothyroidism: She reports compliance with levothyroxine 75 mcg daily in the morning before eating or drinking.  Elevated a1c Last a1c 6.2.> 6.2>6.2>6.3>5.8 with diet and exercise alone. Never has required medications for DM  GERD/esophagel stricture-stenosis:  Protonix  use prn. She feels she is getting  better but will still require med on occassions. She states anything with red sauce or spicy, it creates a great deal of heartburn.      02/11/2023    9:07 AM 01/15/2023    2:18 PM 01/23/2022    8:07 AM 05/03/2021    3:54 PM 01/24/2021    8:43 AM  Depression screen PHQ 2/9  Decreased Interest 0 0 0 0 0  Down, Depressed, Hopeless 0 0 0 0 0  PHQ - 2 Score 0 0 0 0 0      02/11/2023    9:07 AM  GAD 7 : Generalized Anxiety Score  Nervous, Anxious, on Edge 0  Control/stop worrying 0  Worry too much - different things 0  Trouble relaxing 0  Restless 0  Easily annoyed or irritable 0  Afraid - awful might happen 0  Total GAD 7 Score 0     Immunization History  Administered Date(s) Administered   Fluad Quad(high Dose 65+) 10/12/2019   Influenza,inj,Quad PF,6+ Mos 08/13/2018, 08/10/2021   Influenza,inj,quad, With Preservative 09/25/2019   Tdap 12/17/2013   Zoster Recombinat (Shingrix) 04/08/2017, 06/17/2017    Past Medical History:  Diagnosis Date   Allergy    Anxiousness 04/27/2021   Esophageal stricture    GERD (gastroesophageal reflux disease)    Hiatal hernia    Hyperlipidemia    Hypothyroidism    Allergies  Allergen Reactions   Nsaids    Past Surgical History:  Procedure Laterality Date   APPENDECTOMY  1967   BACK SURGERY     ESOPHAGOGASTRODUODENOSCOPY N/A 02/08/2017   Procedure: ESOPHAGOGASTRODUODENOSCOPY (EGD);  Surgeon:  Manus Gunning, MD;  Location: [facility];  Service: Gastroenterology;  Laterality: N/A;   ESOPHAGOGASTRODUODENOSCOPY (EGD) WITH ESOPHAGEAL DILATION  2004   Family History  Problem Relation Age of Onset   Diabetes Mother    Breast cancer Mother    Hyperlipidemia Mother    Cancer Father        colon/brain   Early death Father    Social History   Social History Narrative   Single. 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   Drinks caffeine.   Wears a seatbelt, smoke detector in the home.   Feels safe in her relationships.     Allergies as of 02/11/2023       Reactions   Nsaids         Medication List        Accurate as of February 11, 2023  9:24 AM. If you have any questions, ask your nurse or doctor.          STOP taking these medications    cyclobenzaprine 5 MG tablet Commonly known as: FLEXERIL Stopped by: Howard Pouch, DO       TAKE these medications    cholecalciferol 25 MCG (1000 UNIT) tablet Commonly known as: VITAMIN D3 Take 4,000 Units by mouth daily.   levothyroxine 75 MCG tablet Commonly known as: SYNTHROID TAKE 1 TABLET BY MOUTH EVERY MORNING   lisinopril 30 MG tablet Commonly known as: ZESTRIL Take 1 tablet (30 mg total) by mouth daily.   pantoprazole 40 MG tablet Commonly known as: PROTONIX Take 1 tablet (40 mg total) by mouth daily as needed.   rosuvastatin 10 MG tablet Commonly known as: Crestor Take 1 tablet (10 mg total) by mouth daily.       All past medical history, surgical history, allergies, family history, immunizations andmedications were updated in the EMR today and reviewed under the history and medication portions of their EMR.     No results found for this or any previous visit (from the past 2160 hour(s)).   No results found.  ROS: 14 pt review of systems performed and negative (unless mentioned in an HPI)  Objective: BP 131/72   Pulse 90   Temp 98 F (36.7 C)   Ht '5\' 2"'$  (1.575 m)   Wt 123 lb (55.8 kg)   LMP 02/13/2005   SpO2 98%   BMI 22.50 kg/m  Physical Exam Vitals and nursing note reviewed.  Constitutional:      General: She is not in acute distress.    Appearance: Normal appearance. She is not ill-appearing or toxic-appearing.  HENT:     Head: Normocephalic and atraumatic.     Right Ear: Tympanic membrane, ear canal and external ear normal. There is no impacted cerumen.     Left Ear: Tympanic membrane, ear canal and external ear normal. There is no impacted cerumen.     Nose: No congestion or rhinorrhea.      Mouth/Throat:     Mouth: Mucous membranes are moist.     Pharynx: Oropharynx is clear. No oropharyngeal exudate or posterior oropharyngeal erythema.  Eyes:     General: No scleral icterus.       Right eye: No discharge.        Left eye: No discharge.     Extraocular Movements: Extraocular movements intact.     Conjunctiva/sclera: Conjunctivae normal.     Pupils: Pupils are equal, round, and reactive to light.  Cardiovascular:     Rate and Rhythm: Normal  rate and regular rhythm.     Pulses: Normal pulses.     Heart sounds: Normal heart sounds. No murmur heard.    No friction rub. No gallop.  Pulmonary:     Effort: Pulmonary effort is normal. No respiratory distress.     Breath sounds: Normal breath sounds. No stridor. No wheezing, rhonchi or rales.  Chest:     Chest wall: No tenderness.  Abdominal:     General: Abdomen is flat. Bowel sounds are normal. There is no distension.     Palpations: Abdomen is soft. There is no mass.     Tenderness: There is no abdominal tenderness. There is no right CVA tenderness, left CVA tenderness, guarding or rebound.     Hernia: No hernia is present.  Musculoskeletal:        General: No swelling, tenderness or deformity. Normal range of motion.     Cervical back: Normal range of motion and neck supple. No rigidity or tenderness.     Right lower leg: No edema.     Left lower leg: No edema.  Lymphadenopathy:     Cervical: No cervical adenopathy.  Skin:    General: Skin is warm and dry.     Coloration: Skin is not jaundiced or pale.     Findings: No bruising, erythema, lesion or rash.  Neurological:     General: No focal deficit present.     Mental Status: She is alert and oriented to person, place, and time. Mental status is at baseline.     Cranial Nerves: No cranial nerve deficit.     Sensory: No sensory deficit.     Motor: No weakness.     Coordination: Coordination normal.     Gait: Gait normal.     Deep Tendon Reflexes: Reflexes normal.   Psychiatric:        Mood and Affect: Mood normal.        Behavior: Behavior normal.        Thought Content: Thought content normal.        Judgment: Judgment normal.      No results found for this or any previous visit (from the past 48 hour(s)).   Assessment/plan: EMBERLIE WALTRIP is a 67 y.o. female present for physical exam with routine chronic condition follow-up combination appointment  Essential hypertension/HLD/on statin therapy Stable -  goal is less than 130/80. Continue lisinopril to 30 mg QD Continue crestor 10 mg qd.  - low sodium diet. -Routine exercise encouraged. CBC, CMP, TSH and lipids collected today - f/u 5.5 mos.   Hypothyroidism: Continue 75 mcg levothyroxine Qd. refills will be provided in appropriate dose based on lab result today TSH and T4 free collected today  Elevated a1c: - A1c was 6.6 --> 5.4>>6.2> 6.1>6.2> 6.2>6.3 > 5.9 > 5.7 a1c collected today -Patient has never required medication treatment for her elevated A1c she had changed diet and was able to bring down a1c.  GERD:  Stable Continue Protonix as needed and Pepcid as needed.  Vitamin D/osteopenia: Supplementing Vit D 4000u.  Vit d lab collected today  pneumococcal vaccine declined Routine general medical examination at a health care facility Patient was encouraged to exercise greater than 150 minutes a week. Patient was encouraged to choose a diet filled with fresh fruits and vegetables, and lean meats. AVS provided to patient today for education/recommendation on gender specific health and safety maintenance. Colonoscopy: Completed FOBT 10/2022.  Est w/ [place] and desires colonoscopy now. (Referred) Mammogram:  Breast  cancer in family. Yearly exams through GYN. Pt reports one in the last year and normal.  Cervical cancer screening: Routine exam through GYN. Immunizations: tdap 2015, Influenza UTD (encouraged yearly), PNA series start at 65-has declined,  Shingrix series completed.   Infectious disease screening: HIV and Hep C completed  DEXA: low vit d, estrogen deficient.  Complete 06/2021-> at Gyn -Osteopenia.   Return in about 24 weeks (around 07/29/2023) for Routine chronic condition follow-up.   Orders Placed This Encounter  Procedures   VITAMIN D 25 Hydroxy (Vit-D Deficiency, Fractures)   CBC   Comprehensive metabolic panel   Hemoglobin A1c   Lipid panel   TSH   T4, free   Ambulatory referral to Gastroenterology    Meds ordered this encounter  Medications   lisinopril (ZESTRIL) 30 MG tablet    Sig: Take 1 tablet (30 mg total) by mouth daily.    Dispense:  30 tablet    Refill:  0   pantoprazole (PROTONIX) 40 MG tablet    Sig: Take 1 tablet (40 mg total) by mouth daily as needed.    Dispense:  90 tablet    Refill:  3   rosuvastatin (CRESTOR) 10 MG tablet    Sig: Take 1 tablet (10 mg total) by mouth daily.    Dispense:  90 tablet    Refill:  3    Referral Orders         Ambulatory referral to Gastroenterology       Electronically signed by: Howard Pouch, DO [place]

## 2023-02-11 NOTE — Patient Instructions (Addendum)
Return in about 24 weeks (around 07/29/2023) for Routine chronic condition follow-up.        Great to see you today.  I have refilled the medication(s) we provide.   If labs were collected, we will inform you of lab results once received either by echart message or telephone call.   - echart message- for normal results that have been seen by the patient already.   - telephone call: abnormal results or if patient has not viewed results in their echart.   [facility] Phone: [phone]

## 2023-02-12 ENCOUNTER — Telehealth: Payer: Self-pay | Admitting: Family Medicine

## 2023-02-12 MED ORDER — LEVOTHYROXINE SODIUM 75 MCG PO TABS: ORAL_TABLET | ORAL | 3 refills | Status: DC

## 2023-02-12 NOTE — Telephone Encounter (Signed)
Please call patient Liver, kidney and thyroid function are normal.  I have called in refills on her thyroid medication at the same dose. Blood cell counts and electrolytes are normal Diabetes screening/A1c is now in the diabetic range at 6.6.    -Her options are:        -Of course increase exercise, cut back on sugars and unhealthy carbohydrates such as flour, Pasta etc.  Focus on lean meats and vegetables, high in fiber.       -She could work on diet and exercise again and see if we can get her A1c into normal range, as she has done in the past.     Or      she can still work on her diet and exercise, but start a medication called metformin to day, to help decrease her sugars. Please advise on her decision  Cholesterol panel is above goal.  Her goal should be an LDL of at least lower than 100 now.   -Would encourage her to make sure she is taking her rosuvastatin 10 mg nightly.  If still above goal next visit would need to consider increasing dose of her rosuvastatin.

## 2023-02-12 NOTE — Telephone Encounter (Signed)
Pt does not take the rosuvastatin due to the side effects she was having. States that she was having cramps really bad and was sick a lot when she took this medication. She will work on diet for A1C.

## 2023-02-25 ENCOUNTER — Encounter: Payer: Self-pay | Admitting: Family Medicine

## 2023-03-02 ENCOUNTER — Other Ambulatory Visit: Payer: Self-pay | Admitting: Family Medicine

## 2023-03-06 DIAGNOSIS — E039 Hypothyroidism, unspecified: Secondary | ICD-10-CM | POA: Diagnosis not present

## 2023-03-06 DIAGNOSIS — E785 Hyperlipidemia, unspecified: Secondary | ICD-10-CM | POA: Diagnosis not present

## 2023-03-06 DIAGNOSIS — Z1231 Encounter for screening mammogram for malignant neoplasm of breast: Secondary | ICD-10-CM | POA: Diagnosis not present

## 2023-03-06 DIAGNOSIS — N6002 Solitary cyst of left breast: Secondary | ICD-10-CM | POA: Diagnosis not present

## 2023-03-06 DIAGNOSIS — Z01419 Encounter for gynecological examination (general) (routine) without abnormal findings: Secondary | ICD-10-CM | POA: Diagnosis not present

## 2023-03-06 DIAGNOSIS — M8580 Other specified disorders of bone density and structure, unspecified site: Secondary | ICD-10-CM | POA: Diagnosis not present

## 2023-03-06 LAB — HM MAMMOGRAPHY

## 2023-03-08 ENCOUNTER — Other Ambulatory Visit: Payer: Self-pay | Admitting: Internal Medicine

## 2023-03-08 DIAGNOSIS — M8580 Other specified disorders of bone density and structure, unspecified site: Secondary | ICD-10-CM

## 2023-03-12 ENCOUNTER — Telehealth: Payer: Self-pay | Admitting: Family Medicine

## 2023-03-12 NOTE — Telephone Encounter (Signed)
Pt is needing some clarification on getting a colonoscopy and another procedure she could not think of the name at this time. However, she mentioned to Dr. Raoul Pitch she wanted to have both procedures done at the same time so she would not have to be put to sleep twice.Please give the patient a call to discuss.

## 2023-03-13 NOTE — Telephone Encounter (Signed)
Attempted to contact pt and was unable to LVM 

## 2023-03-18 ENCOUNTER — Other Ambulatory Visit: Payer: Self-pay | Admitting: Family Medicine

## 2023-03-21 ENCOUNTER — Encounter: Payer: Self-pay | Admitting: Internal Medicine

## 2023-04-04 DIAGNOSIS — H5203 Hypermetropia, bilateral: Secondary | ICD-10-CM | POA: Diagnosis not present

## 2023-04-09 ENCOUNTER — Other Ambulatory Visit: Payer: Self-pay | Admitting: Family Medicine

## 2023-04-16 DIAGNOSIS — Z0100 Encounter for examination of eyes and vision without abnormal findings: Secondary | ICD-10-CM | POA: Diagnosis not present

## 2023-06-04 ENCOUNTER — Ambulatory Visit: Payer: Medicare HMO | Admitting: Internal Medicine

## 2023-06-04 ENCOUNTER — Encounter: Payer: Self-pay | Admitting: Internal Medicine

## 2023-06-04 VITALS — BP 134/64 | HR 80 | Ht 61.0 in | Wt 124.5 lb

## 2023-06-04 DIAGNOSIS — Z1211 Encounter for screening for malignant neoplasm of colon: Secondary | ICD-10-CM

## 2023-06-04 DIAGNOSIS — R1319 Other dysphagia: Secondary | ICD-10-CM

## 2023-06-04 DIAGNOSIS — K222 Esophageal obstruction: Secondary | ICD-10-CM

## 2023-06-04 DIAGNOSIS — Z800 Family history of malignant neoplasm of digestive organs: Secondary | ICD-10-CM

## 2023-06-04 DIAGNOSIS — K219 Gastro-esophageal reflux disease without esophagitis: Secondary | ICD-10-CM | POA: Diagnosis not present

## 2023-06-04 MED ORDER — OMEPRAZOLE 40 MG PO CPDR: 40.0000 mg | DELAYED_RELEASE_CAPSULE | Freq: Every day | ORAL | 3 refills | Status: DC

## 2023-06-04 MED ORDER — PLENVU 140 G PO SOLR: 1.0000 | Freq: Once | ORAL | 0 refills | Status: AC

## 2023-06-04 NOTE — Patient Instructions (Signed)
We have sent the following medications to your pharmacy for you to pick up at your convenience:  Omeprazole  You have been scheduled for an endoscopy and colonoscopy. Please follow the written instructions given to you at your visit today. Please pick up your prep supplies at the pharmacy within the next 1-3 days. If you use inhalers (even only as needed), please bring them with you on the day of your procedure.  _______________________________________________________  If your blood pressure at your visit was 140/90 or greater, please contact your primary care physician to follow up on this.  _______________________________________________________  If you are age 69 or older, your body mass index should be between 23-30. Your Body mass index is 23.52 kg/m. If this is out of the aforementioned range listed, please consider follow up with your Primary Care Provider.  If you are age 56 or younger, your body mass index should be between 19-25. Your Body mass index is 23.52 kg/m. If this is out of the aformentioned range listed, please consider follow up with your Primary Care Provider.   ________________________________________________________  The [facility] providers would like to encourage you to use [facility] to communicate with providers for non-urgent requests or questions.  Due to long hold times on the telephone, sending your provider a message by [facility] may be a faster and more efficient way to get a response.  Please allow 48 business hours for a response.  Please remember that this is for non-urgent requests.  _______________________________________________________

## 2023-06-04 NOTE — Progress Notes (Signed)
HISTORY OF PRESENT ILLNESS:  Sheila [name] is a 67 y.o. 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  Patient reports that she underwent colonoscopy and upper endoscopy over 30 years ago.  None since.  Her more recent history is that of acute food impaction in February 2018 for which she underwent upper endoscopy with endoscopic removal of food impaction by Dr. Adela Lank.  She was seen in the office shortly thereafter.  Upper endoscopy scheduled.  The patient canceled and has not been seen since.  Patient tells me that she has active reflux symptoms for which she takes Prilosec OTC on demand.  She has noticed that her dysphagia is worse when she is not taking PPI.  She needs to be careful.  No vomiting.  She reports that her father was diagnosed with colon cancer around age 71.  Died before he was 60.  She denies any lower GI complaints.  Review of blood work from February 11, 2023 shows unremarkable comprehensive metabolic panel and unremarkable CBC with hemoglobin 12.9.  Hemoglobin A1c 6.6.  REVIEW OF SYSTEMS:  All non-GI ROS negative. Past Medical History:  Diagnosis Date   Allergy    Anxiousness 04/27/2021   Esophageal stricture    GERD (gastroesophageal reflux disease)    Hiatal hernia    HTN (hypertension)    Hyperlipidemia    Hypothyroidism     Past Surgical History:  Procedure Laterality Date   APPENDECTOMY  1967   BACK SURGERY     pinched nerve   ESOPHAGOGASTRODUODENOSCOPY N/A 02/08/2017   Procedure: ESOPHAGOGASTRODUODENOSCOPY (EGD);  Surgeon: Ruffin Frederick, MD;  Location: [facility];  Service: Gastroenterology;  Laterality: N/A;   ESOPHAGOGASTRODUODENOSCOPY (EGD) WITH ESOPHAGEAL DILATION  2004    Social History Sheila [name]  reports that she has never smoked. She has never used smokeless tobacco. She reports that she does not  drink alcohol and does not use drugs.  family history includes Brain cancer in her father; Breast cancer in her mother; Colon cancer in her father; Diabetes in her half-brother and mother; Early death in her father; Emphysema in her maternal grandfather; Heart disease in her maternal grandmother; Hyperlipidemia in her mother; Hypertension in her half-brother.  Allergies  Allergen Reactions   Nsaids        PHYSICAL EXAMINATION: Vital signs: BP 134/64 (BP Location: Left Arm, Patient Position: Sitting, Cuff Size: Normal)   Pulse 80   Ht 5\' 1"  (1.549 m) Comment: height measured without shoes  Wt 124 lb 8 oz (56.5 kg)   LMP 02/13/2005   BMI 23.52 kg/m   Constitutional: generally well-appearing, no acute distress Psychiatric: alert and oriented x3, cooperative Eyes: extraocular movements intact, anicteric, conjunctiva pink Mouth: oral pharynx moist, no lesions Neck: supple no lymphadenopathy Cardiovascular: heart regular rate and rhythm, no murmur Lungs: clear to auscultation bilaterally Abdomen: soft, nontender, nondistended, no obvious ascites, no peritoneal signs, normal bowel sounds, no organomegaly Rectal: Deferred until colonoscopy Extremities: no clubbing, cyanosis, or lower extremity edema bilaterally Skin: no lesions on visible extremities Neuro: No focal deficits.  Cranial nerves intact  ASSESSMENT:  1.  Chronic GERD. 2.  Intermittent solid food dysphagia secondary to known peptic stricture 3.  History of acute food impaction requiring endoscopic attention 4.  Family history of colon cancer in her father less than age 74.  Has not had colonoscopy in decades   PLAN:  1.  Reflux precautions 2.  Prescribe omeprazole 40 mg daily; #30; 11 refills.  Medication risks reviewed 3.  Schedule upper endoscopy with esophageal dilation.The nature of the procedure, as well as the risks, benefits, and alternatives were carefully and thoroughly reviewed with the patient. Ample time for  discussion and questions allowed. The patient understood, was satisfied, and agreed to proceed. 4.  Schedule screening colonoscopy.The nature of the procedure, as well as the risks, benefits, and alternatives were carefully and thoroughly reviewed with the patient. Ample time for discussion and questions allowed. The patient understood, was satisfied, and agreed to proceed. A total time of 60 minutes were spent preparing to see the patient, reviewing data, obtaining comprehensive history, performing medically appropriate physical exam, counseling and educating the patient regarding the above listed issues, ordering medication, ordering therapeutic endoscopic procedure, ordering screening endoscopic procedure, and documenting clinical information in the health record

## 2023-07-02 ENCOUNTER — Encounter: Payer: Self-pay | Admitting: Family Medicine

## 2023-07-02 ENCOUNTER — Ambulatory Visit: Payer: Medicare HMO | Admitting: Family Medicine

## 2023-07-02 VITALS — BP 124/74 | HR 79 | Temp 98.0°F | Ht 62.0 in | Wt 122.6 lb

## 2023-07-02 DIAGNOSIS — K219 Gastro-esophageal reflux disease without esophagitis: Secondary | ICD-10-CM

## 2023-07-02 DIAGNOSIS — Z789 Other specified health status: Secondary | ICD-10-CM

## 2023-07-02 DIAGNOSIS — R7309 Other abnormal glucose: Secondary | ICD-10-CM | POA: Diagnosis not present

## 2023-07-02 DIAGNOSIS — E034 Atrophy of thyroid (acquired): Secondary | ICD-10-CM

## 2023-07-02 DIAGNOSIS — G720 Drug-induced myopathy: Secondary | ICD-10-CM | POA: Insufficient documentation

## 2023-07-02 DIAGNOSIS — I10 Essential (primary) hypertension: Secondary | ICD-10-CM

## 2023-07-02 DIAGNOSIS — E782 Mixed hyperlipidemia: Secondary | ICD-10-CM

## 2023-07-02 LAB — POCT GLYCOSYLATED HEMOGLOBIN (HGB A1C)
HbA1c POC (<> result, manual entry): 6.7 % (ref 4.0–5.6)
HbA1c, POC (controlled diabetic range): 6.7 % (ref 0.0–7.0)
HbA1c, POC (prediabetic range): 6.7 % — AB (ref 5.7–6.4)
Hemoglobin A1C: 6.7 % — AB (ref 4.0–5.6)

## 2023-07-02 MED ORDER — LISINOPRIL 30 MG PO TABS: 30.0000 mg | ORAL_TABLET | Freq: Every day | ORAL | 1 refills | Status: DC

## 2023-07-02 MED ORDER — DAPAGLIFLOZIN PROPANEDIOL 5 MG PO TABS: 5.0000 mg | ORAL_TABLET | Freq: Every day | ORAL | 5 refills | Status: AC

## 2023-07-02 NOTE — Patient Instructions (Addendum)
Return in about 24 weeks (around 12/17/2023) for Routine chronic condition follow-up.        Great to see you today.  I have refilled the medication(s) we provide.   If labs were collected, we will inform you of lab results once received either by echart message or telephone call.   - echart message- for normal results that have been seen by the patient already.   - telephone call: abnormal results or if patient has not viewed results in their echart.

## 2023-07-02 NOTE — Progress Notes (Signed)
Patient ID: Sheila [name], female  DOB: August 18, 1956, 67 y.o.   MRN: [id number] Patient Care Team    Relationship Specialty Notifications Start End  Natalia Leatherwood, DO PCP - General Family Medicine  02/08/17   Kirkland Hun, MD Consulting Physician Obstetrics and Gynecology  02/11/17   Hilarie Fredrickson, MD Consulting Physician Gastroenterology  02/11/17   Nita Sells, MD  Dermatology  07/10/22     Chief Complaint  Patient presents with   Hypertension     Subjective: Sheila [name] is a 67 y.o.  Female  present for chronic condition management appointment.  Chart has been updated today with any new PMH, surgical history, family history and medication reconciliation..  Hypertension/HLD/Statin therapy:  Pt reports compliance with lisinopril 30 mg Qd and statin.  Patient denies chest pain, shortness of breath, dizziness or lower extremity edema.  Diet: no routine diet and trying to cut as sugar.  Exercise: nothing routine.  RF: HTN, HLD  Hypothyroidism: She reports compliance with levothyroxine 75 mcg daily in the morning before eating or drinking.  Labs up-to-date 01/2023  Elevated A1c/diabetes Never has required medications for DM.  She has lost 2 lbs. Last visit A1c had increased  GERD/esophagel stricture-stenosis:  Protonix  use prn. She feels she is getting better but will still require med on occassions. She states anything with red sauce or spicy, it creates a great deal of heartburn.  Managed by Dr. Marina Goodell      02/11/2023    9:07 AM 01/15/2023    2:18 PM 01/23/2022    8:07 AM 05/03/2021    3:54 PM 01/24/2021    8:43 AM  Depression screen PHQ 2/9  Decreased Interest 0 0 0 0 0  Down, Depressed, Hopeless 0 0 0 0 0  PHQ - 2 Score 0 0 0 0 0      02/11/2023    9:07 AM  GAD 7 : Generalized Anxiety Score  Nervous, Anxious, on Edge 0  Control/stop worrying 0  Worry too much - different things 0  Trouble relaxing 0  Restless 0  Easily annoyed or irritable 0  Afraid -  awful might happen 0  Total GAD 7 Score 0     Immunization History  Administered Date(s) Administered   Fluad Quad(high Dose 65+) 10/12/2019   Influenza,inj,Quad PF,6+ Mos 08/13/2018, 08/10/2021   Influenza,inj,quad, With Preservative 09/25/2019   Tdap 12/17/2013   Zoster Recombinant(Shingrix) 04/08/2017, 06/17/2017    Past Medical History:  Diagnosis Date   Allergy    Anxiousness 04/27/2021   Esophageal stricture    GERD (gastroesophageal reflux disease)    Hiatal hernia    HTN (hypertension)    Hyperlipidemia    Hypothyroidism    Allergies  Allergen Reactions   Nsaids    Past Surgical History:  Procedure Laterality Date   APPENDECTOMY  1967   BACK SURGERY     pinched nerve   ESOPHAGOGASTRODUODENOSCOPY N/A 02/08/2017   Procedure: ESOPHAGOGASTRODUODENOSCOPY (EGD);  Surgeon: Ruffin Frederick, MD;  Location: [facility];  Service: Gastroenterology;  Laterality: N/A;   ESOPHAGOGASTRODUODENOSCOPY (EGD) WITH ESOPHAGEAL DILATION  2004   Family History  Problem Relation Age of Onset   Diabetes Mother    Breast cancer Mother    Hyperlipidemia Mother    Colon cancer Father        mets to brain   Early death Father    Brain cancer Father    Heart disease Maternal Grandmother    Emphysema  Maternal Grandfather    Diabetes Half-Brother    Hypertension Half-Brother    Social History   Social History Narrative   Single. 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   Drinks caffeine.   Wears a seatbelt, smoke detector in the home.   Feels safe in her relationships.    Allergies as of 07/02/2023       Reactions   Nsaids         Medication List        Accurate as of July 02, 2023  8:44 AM. If you have any questions, ask your nurse or doctor.          STOP taking these medications    rosuvastatin 10 MG tablet Commonly known as: Crestor Stopped by: Felix Pacini       TAKE these medications    cholecalciferol 25 MCG (1000 UNIT) tablet Commonly  known as: VITAMIN D3 Take 4,000 Units by mouth daily.   dapagliflozin propanediol 5 MG Tabs tablet Commonly known as: Farxiga Take 1 tablet (5 mg total) by mouth daily. Started by: Felix Pacini   doxycycline 50 MG capsule Commonly known as: VIBRAMYCIN Take 50 mg by mouth 2 (two) times daily.   famotidine 20 MG tablet Commonly known as: PEPCID Take 20 mg by mouth daily.   levothyroxine 75 MCG tablet Commonly known as: SYNTHROID TAKE 1 TABLET BY MOUTH EVERY MORNING   lisinopril 30 MG tablet Commonly known as: ZESTRIL Take 1 tablet (30 mg total) by mouth daily.   omeprazole 40 MG capsule Commonly known as: PRILOSEC Take 1 capsule (40 mg total) by mouth daily.   Plenvu 140 g Solr Generic drug: PEG-KCl-NaCl-NaSulf-Na Asc-C See admin instructions.       All past medical history, surgical history, allergies, family history, immunizations andmedications were updated in the EMR today and reviewed under the history and medication portions of their EMR.     Recent Results (from the past 2160 hour(s))  POCT HgB A1C     Status: Abnormal   Collection Time: 07/02/23  8:26 AM  Result Value Ref Range   Hemoglobin A1C 6.7 (A) 4.0 - 5.6 %   HbA1c POC (<> result, manual entry) 6.7 4.0 - 5.6 %   HbA1c, POC (prediabetic range) 6.7 (A) 5.7 - 6.4 %   HbA1c, POC (controlled diabetic range) 6.7 0.0 - 7.0 %     No results found.  ROS: 14 pt review of systems performed and negative (unless mentioned in an HPI)  Objective: BP 124/74   Pulse 79   Temp 98 F (36.7 C)   Ht 5\' 2"  (1.575 m)   Wt 122 lb 9.6 oz (55.6 kg)   LMP 02/13/2005   SpO2 98%   BMI 22.42 kg/m  Physical Exam Vitals and nursing note reviewed.  Constitutional:      General: She is not in acute distress.    Appearance: Normal appearance. She is not ill-appearing, toxic-appearing or diaphoretic.  HENT:     Head: Normocephalic and atraumatic.  Eyes:     General: No scleral icterus.       Right eye: No discharge.         Left eye: No discharge.     Extraocular Movements: Extraocular movements intact.     Conjunctiva/sclera: Conjunctivae normal.     Pupils: Pupils are equal, round, and reactive to light.  Cardiovascular:     Rate and Rhythm: Normal rate and regular rhythm.  Pulmonary:     Effort:  Pulmonary effort is normal. No respiratory distress.     Breath sounds: Normal breath sounds. No wheezing, rhonchi or rales.  Musculoskeletal:     Right lower leg: No edema.     Left lower leg: No edema.  Skin:    General: Skin is warm.     Findings: No rash.  Neurological:     Mental Status: She is alert and oriented to person, place, and time. Mental status is at baseline.     Motor: No weakness.     Gait: Gait normal.  Psychiatric:        Mood and Affect: Mood normal.        Behavior: Behavior normal.        Thought Content: Thought content normal.        Judgment: Judgment normal.     Results for orders placed or performed in visit on 07/02/23 (from the past 48 hour(s))  POCT HgB A1C     Status: Abnormal   Collection Time: 07/02/23  8:26 AM  Result Value Ref Range   Hemoglobin A1C 6.7 (A) 4.0 - 5.6 %   HbA1c POC (<> result, manual entry) 6.7 4.0 - 5.6 %   HbA1c, POC (prediabetic range) 6.7 (A) 5.7 - 6.4 %   HbA1c, POC (controlled diabetic range) 6.7 0.0 - 7.0 %     Assessment/plan: Sheila [name] is a 67 y.o. female present for management of chronic conditions Essential hypertension/HLD/on statin therapy Stable -  goal is less than 130/80. Continue lisinopril to 30 mg QD Intolerant to crestor 10 mg qd.  - low sodium diet. -Routine exercise encouraged.  Hypothyroidism: Continue 75 mcg levothyroxine Qd.  Elevated a1c: - A1c was 6.6 --> 5.4>>6.2> 6.1>6.2> 6.2>6.3 > 5.9 > 5.7>6.6> 6.7 a1c collected today -Patient has never required medication treatment for her elevated A1c she had changed diet and was able to bring down a1c. Metformin gave her diarrhea.  Start farxiga 5 mg every  day F/u 5.5 mos.   GERD:  Stable Continue Protonix as needed and Pepcid as needed. Managed by Dr. Shari Prows  Vitamin D/osteopenia: Supplementing Vit D 4000u.  Vit d labs due next visit  Return in about 24 weeks (around 12/17/2023) for Routine chronic condition follow-up.   Orders Placed This Encounter  Procedures   POCT HgB A1C    Meds ordered this encounter  Medications   lisinopril (ZESTRIL) 30 MG tablet    Sig: Take 1 tablet (30 mg total) by mouth daily.    Dispense:  90 tablet    Refill:  1   dapagliflozin propanediol (FARXIGA) 5 MG TABS tablet    Sig: Take 1 tablet (5 mg total) by mouth daily.    Dispense:  30 tablet    Refill:  5    Referral Orders  No referral(s) requested today      Electronically signed by: Felix Pacini, DO [facility] [place]

## 2023-07-09 ENCOUNTER — Telehealth: Payer: Self-pay | Admitting: Family Medicine

## 2023-07-09 NOTE — Telephone Encounter (Signed)
Patient called and would like a CMA to give her a cal back regarding discussing dapagliflozin propanediol (FARXIGA) 5 MG TABS tablet and options on how to improve her sugar without the medication. She also had some concerns about lab work.

## 2023-07-09 NOTE — Telephone Encounter (Signed)
Spoke with patient regarding results/recommendations.   FYI: Pt wants to work on diet to get A1c down. States she has a lot of things going on and does not want to add anything else at the moment

## 2023-07-23 ENCOUNTER — Encounter: Payer: Self-pay | Admitting: Internal Medicine

## 2023-07-23 ENCOUNTER — Telehealth: Payer: Self-pay

## 2023-07-23 ENCOUNTER — Ambulatory Visit (AMBULATORY_SURGERY_CENTER): Payer: Medicare HMO | Admitting: Internal Medicine

## 2023-07-23 VITALS — BP 104/51 | HR 112 | Temp 98.0°F | Resp 21 | Ht 61.0 in | Wt 124.0 lb

## 2023-07-23 DIAGNOSIS — F419 Anxiety disorder, unspecified: Secondary | ICD-10-CM | POA: Diagnosis not present

## 2023-07-23 DIAGNOSIS — R1310 Dysphagia, unspecified: Secondary | ICD-10-CM | POA: Diagnosis not present

## 2023-07-23 DIAGNOSIS — K2100 Gastro-esophageal reflux disease with esophagitis, without bleeding: Secondary | ICD-10-CM | POA: Diagnosis not present

## 2023-07-23 DIAGNOSIS — E039 Hypothyroidism, unspecified: Secondary | ICD-10-CM | POA: Diagnosis not present

## 2023-07-23 DIAGNOSIS — D122 Benign neoplasm of ascending colon: Secondary | ICD-10-CM

## 2023-07-23 DIAGNOSIS — K219 Gastro-esophageal reflux disease without esophagitis: Secondary | ICD-10-CM

## 2023-07-23 DIAGNOSIS — K222 Esophageal obstruction: Secondary | ICD-10-CM | POA: Diagnosis not present

## 2023-07-23 DIAGNOSIS — R1319 Other dysphagia: Secondary | ICD-10-CM

## 2023-07-23 DIAGNOSIS — Z800 Family history of malignant neoplasm of digestive organs: Secondary | ICD-10-CM

## 2023-07-23 DIAGNOSIS — D124 Benign neoplasm of descending colon: Secondary | ICD-10-CM

## 2023-07-23 DIAGNOSIS — K635 Polyp of colon: Secondary | ICD-10-CM | POA: Diagnosis not present

## 2023-07-23 DIAGNOSIS — I10 Essential (primary) hypertension: Secondary | ICD-10-CM | POA: Diagnosis not present

## 2023-07-23 DIAGNOSIS — Z1211 Encounter for screening for malignant neoplasm of colon: Secondary | ICD-10-CM | POA: Diagnosis not present

## 2023-07-23 MED ORDER — SODIUM CHLORIDE 0.9 % IV SOLN: 500.0000 mL | Freq: Once | INTRAVENOUS | Status: DC

## 2023-07-23 NOTE — Op Note (Signed)
[facility] Patient Name: Sheila [name] Procedure Date: 07/23/2023 9:24 AM MRN: [id number] Endoscopist: Wilhemina Bonito. Marina Goodell , MD, [id number] Age: 67 Referring MD:  Date of Birth: 16-Aug-1956 Gender: Female Account #: [id number] Procedure:                Colonoscopy with cold snare polypectomy x 3; biopsy                            polypectomy x 1 Indications:              Screening for colorectal malignant neoplasm. Father                            with colon cancer age 95 Medicines:                Monitored Anesthesia Care Procedure:                Pre-Anesthesia Assessment:                           - Prior to the procedure, a History and Physical                            was performed, and patient medications and                            allergies were reviewed. The patient's tolerance of                            previous anesthesia was also reviewed. The risks                            and benefits of the procedure and the sedation                            options and risks were discussed with the patient.                            All questions were answered, and informed consent                            was obtained. Prior Anticoagulants: The patient has                            taken no anticoagulant or antiplatelet agents. ASA                            Grade Assessment: II - A patient with mild systemic                            disease. After reviewing the risks and benefits,                            the patient was deemed in satisfactory condition to  undergo the procedure.                           After obtaining informed consent, the colonoscope                            was passed under direct vision. Throughout the                            procedure, the patient's blood pressure, pulse, and                            oxygen saturations were monitored continuously. The                            CF [id number] #[id number] was  introduced through the anus                            and advanced to the the cecum, identified by                            appendiceal orifice and ileocecal valve. The                            ileocecal valve, appendiceal orifice, and rectum                            were photographed. The quality of the bowel                            preparation was excellent. The colonoscopy was                            performed without difficulty. The patient tolerated                            the procedure well. The bowel preparation used was                            SUPREP via split dose instruction. Scope In: 9:27:34 AM Scope Out: 9:40:18 AM Scope Withdrawal Time: 0 hours 10 minutes 52 seconds  Total Procedure Duration: 0 hours 12 minutes 44 seconds  Findings:                 Three polyps were found in the ascending colon. The                            polyps were 1 to 3 mm in size. These polyps were                            removed with a cold snare. Resection and retrieval                            were complete.  A 1 mm polyp was found in the descending colon. The                            polyp was removed with a jumbo cold forceps.                            Resection and retrieval were complete.                           Multiple diverticula were found in the sigmoid                            colon.                           The exam was otherwise without abnormality on                            direct and retroflexion views. Complications:            No immediate complications. Estimated blood loss:                            None. Estimated Blood Loss:     Estimated blood loss: none. Impression:               - Three 1 to 3 mm polyps in the ascending colon,                            removed with a cold snare. Resected and retrieved.                           - One 1 mm polyp in the descending colon, removed                            with a  jumbo cold forceps. Resected and retrieved.                           - Diverticulosis in the sigmoid colon.                           - The examination was otherwise normal on direct                            and retroflexion views. Recommendation:           - Repeat colonoscopy in 5 years for surveillance.                           - Patient has a contact number available for                            emergencies. The signs and symptoms of potential  delayed complications were discussed with the                            patient. Return to normal activities tomorrow.                            Written discharge instructions were provided to the                            patient.                           - Resume previous diet.                           - Continue present medications.                           - Await pathology results. Wilhemina Bonito. Marina Goodell, MD 07/23/2023 9:46:25 AM This report has been signed electronically.

## 2023-07-23 NOTE — Telephone Encounter (Signed)
Spoke to patient and let her know she was scheduled for a follow up with Dr. Marina Goodell on 9/17 at 10:20am.  Patient agreed.

## 2023-07-23 NOTE — Progress Notes (Signed)
Expand All Collapse All HISTORY OF PRESENT ILLNESS:   Sheila [name] is a 67 y.o. 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  Patient reports that she underwent colonoscopy and upper endoscopy over 30 years ago.  None since.  Her more recent history is that of acute food impaction in February 2018 for which she underwent upper endoscopy with endoscopic removal of food impaction by Dr. Adela Lank.  She was seen in the office shortly thereafter.  Upper endoscopy scheduled.  The patient canceled and has not been seen since.   Patient tells me that she has active reflux symptoms for which she takes Prilosec OTC on demand.  She has noticed that her dysphagia is worse when she is not taking PPI.  She needs to be careful.  No vomiting.   She reports that her father was diagnosed with colon cancer around age 24.  Died before he was 60.  She denies any lower GI complaints.   Review of blood work from February 11, 2023 shows unremarkable comprehensive metabolic panel and unremarkable CBC with hemoglobin 12.9.  Hemoglobin A1c 6.6.   REVIEW OF SYSTEMS:   All non-GI ROS negative.     Past Medical History:  Diagnosis Date   Allergy     Anxiousness 04/27/2021   Esophageal stricture     GERD (gastroesophageal reflux disease)     Hiatal hernia     HTN (hypertension)     Hyperlipidemia     Hypothyroidism                 Past Surgical History:  Procedure Laterality Date   APPENDECTOMY   1967   BACK SURGERY        pinched nerve   ESOPHAGOGASTRODUODENOSCOPY N/A 02/08/2017    Procedure: ESOPHAGOGASTRODUODENOSCOPY (EGD);  Surgeon: Ruffin Frederick, MD;  Location: [facility];  Service: Gastroenterology;  Laterality: N/A;   ESOPHAGOGASTRODUODENOSCOPY (EGD) WITH ESOPHAGEAL DILATION   2004          Social History Sheila [name]  reports that she has never smoked.  She has never used smokeless tobacco. She reports that she does not drink alcohol and does not use drugs.   family history includes Brain cancer in her father; Breast cancer in her mother; Colon cancer in her father; Diabetes in her half-brother and mother; Early death in her father; Emphysema in her maternal grandfather; Heart disease in her maternal grandmother; Hyperlipidemia in her mother; Hypertension in her half-brother.   Allergies      Allergies  Allergen Reactions   Nsaids              PHYSICAL EXAMINATION: Vital signs: BP 134/64 (BP Location: Left Arm, Patient Position: Sitting, Cuff Size: Normal)   Pulse 80   Ht 5\' 1"  (1.549 m) Comment: height measured without shoes  Wt 124 lb 8 oz (56.5 kg)   LMP 02/13/2005   BMI 23.52 kg/m   Constitutional: generally well-appearing, no acute distress Psychiatric: alert and oriented x3, cooperative Eyes: extraocular movements intact, anicteric, conjunctiva pink Mouth: oral pharynx moist, no lesions Neck: supple no lymphadenopathy Cardiovascular: heart regular rate and rhythm, no murmur Lungs: clear to auscultation bilaterally Abdomen: soft, nontender, nondistended, no obvious ascites, no peritoneal signs, normal bowel sounds, no organomegaly Rectal: Deferred until colonoscopy Extremities: no clubbing, cyanosis, or lower extremity edema bilaterally Skin: no lesions on visible extremities Neuro: No focal deficits.  Cranial nerves intact   ASSESSMENT:   1.  Chronic GERD. 2.  Intermittent solid food dysphagia secondary to known peptic stricture 3.  History of acute food impaction requiring endoscopic attention 4.  Family history of colon cancer in her father less than age 66.  Has not had colonoscopy in decades     PLAN:   1.  Reflux precautions 2.  Prescribe omeprazole 40 mg daily; #30; 11 refills.  Medication risks reviewed 3.  Schedule upper endoscopy with esophageal dilation.The nature of the procedure, as well as the risks,  benefits, and alternatives were carefully and thoroughly reviewed with the patient. Ample time for discussion and questions allowed. The patient understood, was satisfied, and agreed to proceed. 4.  Schedule screening colonoscopy.The nature of the procedure, as well as the risks, benefits, and alternatives were carefully and thoroughly reviewed with the patient. Ample time for discussion and questions allowed. The patient understood, was satisfied, and agreed to proceed.  Recent H&P as above.  No interval change.  Now for colonoscopy and upper endoscopy with esophageal dilation.

## 2023-07-23 NOTE — Progress Notes (Signed)
Called to room to assist during endoscopic procedure.  Patient ID and intended procedure confirmed with present staff. Received instructions for my participation in the procedure from the performing physician.  

## 2023-07-23 NOTE — Telephone Encounter (Signed)
-----   Message from Nurse Kerrie Buffalo sent at 07/23/2023 10:29 AM EDT ----- He has openings on 9/17, please schedule pt and let them know about the appt. ----- Message ----- From: Buck Mam, RN Sent: 07/23/2023  10:08 AM EDT To: Jeanine Luz, CMA; Chrystie Nose, RN  Patient needs 6 week f/u appointment with Dr. Marina Goodell post EGD

## 2023-07-23 NOTE — Patient Instructions (Signed)
Resume previous medications including recently prescribed Omeprazole 40 mg daily. Follow post dilation diet today. Follow up with Dr. Marina Goodell in 6 weeks. Office will call to get you scheduled at next available appointment. Awaiting pathology results. Repeat Colonoscopy date to be determined based on pathology reuslts.  YOU HAD AN ENDOSCOPIC PROCEDURE TODAY AT THE [facility]:   Refer to the procedure report that was given to you for any specific questions about what was found during the examination.  If the procedure report does not answer your questions, please call your gastroenterologist to clarify.  If you requested that your care partner not be given the details of your procedure findings, then the procedure report has been included in a sealed envelope for you to review at your convenience later.  YOU SHOULD EXPECT: Some feelings of bloating in the abdomen. Passage of more gas than usual.  Walking can help get rid of the air that was put into your GI tract during the procedure and reduce the bloating. If you had a lower endoscopy (such as a colonoscopy or flexible sigmoidoscopy) you may notice spotting of blood in your stool or on the toilet paper. If you underwent a bowel prep for your procedure, you may not have a normal bowel movement for a few days.  Please Note:  You might notice some irritation and congestion in your nose or some drainage.  This is from the oxygen used during your procedure.  There is no need for concern and it should clear up in a day or so.  SYMPTOMS TO REPORT IMMEDIATELY:  Following lower endoscopy (colonoscopy or flexible sigmoidoscopy):  Excessive amounts of blood in the stool  Significant tenderness or worsening of abdominal pains  Swelling of the abdomen that is new, acute  Fever of 100F or higher  Following upper endoscopy (EGD)  Vomiting of blood or coffee ground material  New chest pain or pain under the shoulder blades  Painful or persistently  difficult swallowing  New shortness of breath  Fever of 100F or higher  Black, tarry-looking stools  For urgent or emergent issues, a gastroenterologist can be reached at any hour by calling [phone]. Do not use MyChart messaging for urgent concerns.    DIET:  We do recommend a small meal at first, but then you may proceed to your regular diet.  Drink plenty of fluids but you should avoid alcoholic beverages for 24 hours.  ACTIVITY:  You should plan to take it easy for the rest of today and you should NOT DRIVE or use heavy machinery until tomorrow (because of the sedation medicines used during the test).    FOLLOW UP: Our staff will call the number listed on your records the next business day following your procedure.  We will call around 7:15- 8:00 am to check on you and address any questions or concerns that you may have regarding the information given to you following your procedure. If we do not reach you, we will leave a message.     If any biopsies were taken you will be contacted by phone or by letter within the next 1-3 weeks.  Please call us at [phone] if you have not heard about the biopsies in 3 weeks.    SIGNATURES/CONFIDENTIALITY: You and/or your care partner have signed paperwork which will be entered into your electronic medical record.  These signatures attest to the fact that that the information above on your After Visit Summary has been reviewed and is  understood.  Full responsibility of the confidentiality of this discharge information lies with you and/or your care-partner.

## 2023-07-23 NOTE — Progress Notes (Signed)
Sedate, gd SR, tolerated procedure well, VSS, report to RN 

## 2023-07-23 NOTE — Op Note (Signed)
[facility] Patient Name: Sheila [name] Procedure Date: 07/23/2023 9:11 AM MRN: [id number] Endoscopist: Wilhemina Bonito. Marina Goodell , MD, [id number] Age: 67 Referring MD:  Date of Birth: 1956-05-09 Gender: Female Account #: [id number] Procedure:                Upper GI endoscopy with balloon dilation of the                            esophagus. 19 mm max Indications:              Dysphagia, Esophageal reflux Medicines:                Monitored Anesthesia Care Procedure:                Pre-Anesthesia Assessment:                           - Prior to the procedure, a History and Physical                            was performed, and patient medications and                            allergies were reviewed. The patient's tolerance of                            previous anesthesia was also reviewed. The risks                            and benefits of the procedure and the sedation                            options and risks were discussed with the patient.                            All questions were answered, and informed consent                            was obtained. Prior Anticoagulants: The patient has                            taken no anticoagulant or antiplatelet agents. ASA                            Grade Assessment: II - A patient with mild systemic                            disease. After reviewing the risks and benefits,                            the patient was deemed in satisfactory condition to                            undergo the procedure.  After obtaining informed consent, the endoscope was                            passed under direct vision. Throughout the                            procedure, the patient's blood pressure, pulse, and                            oxygen saturations were monitored continuously. The                            GIF [id number] #[id number] was introduced through the                            mouth, and advanced to the  second part of duodenum.                            The upper GI endoscopy was accomplished without                            difficulty. The patient tolerated the procedure                            well. Scope In: Scope Out: Findings:                 One benign-appearing, intrinsic moderate stenosis                            was found 30 cm from the incisors. This stenosis                            measured 1.5 cm (inner diameter). A TTS dilator was                            passed through the scope. Dilation with an 18-19-20                            mm balloon dilator was performed to 19 mm. The                            stricture was disrupted.                           The exam of the esophagus revealed edema and                            erythema at the mucosal Z-line. No Barrett's.                           The stomach revealed a large (7 cm) hiatal hernia  with associated Cameron erosions. Stomach was                            otherwise normal.                           The examined duodenum was normal.                           The cardia and gastric fundus were normal on                            retroflexion. Complications:            No immediate complications. Estimated Blood Loss:     Estimated blood loss: none. Impression:               1. GERD with esophagitis and peptic stricture                            status post dilation                           2. Large hiatal hernia with Sheria Lang erosions                           3. Otherwise normal EGD. Recommendation:           1. Patient has a contact number available for                            emergencies. The signs and symptoms of potential                            delayed complications were discussed with the                            patient. Return to normal activities tomorrow.                            Written discharge instructions were provided to the                             patient.                           2. Post dilation diet.                           3. Continue present medications. Resume recently                            prescribed omeprazole 40 mg daily.                           4. Office follow-up with Dr. Marina Goodell in 6 weeks Wilhemina Bonito. Marina Goodell, MD 07/23/2023 10:01:27 AM This report has been signed electronically.

## 2023-07-23 NOTE — Progress Notes (Signed)
VS by DT  Pt's states no medical or surgical changes since previsit or office visit.  

## 2023-07-24 ENCOUNTER — Telehealth: Payer: Self-pay | Admitting: *Deleted

## 2023-07-24 NOTE — Telephone Encounter (Signed)
  Follow up Call-     07/23/2023    8:16 AM  Call back number  Post procedure Call Back phone  # [phone]  Permission to leave phone message Yes     Patient questions:  Do you have a fever, pain , or abdominal swelling? No. Pain Score  0 *  Have you tolerated food without any problems? Yes.    Have you been able to return to your normal activities? Yes.    Do you have any questions about your discharge instructions: Diet   No. Medications  No. Follow up visit  No.  Do you have questions or concerns about your Care? No.  Actions: * If pain score is 4 or above: No action needed, pain <4.

## 2023-07-25 ENCOUNTER — Encounter: Payer: Self-pay | Admitting: Internal Medicine

## 2023-07-29 ENCOUNTER — Ambulatory Visit: Payer: Medicare HMO | Admitting: Family Medicine

## 2023-09-03 ENCOUNTER — Encounter: Payer: Self-pay | Admitting: Internal Medicine

## 2023-09-03 ENCOUNTER — Ambulatory Visit: Payer: Medicare HMO | Admitting: Internal Medicine

## 2023-09-03 VITALS — BP 120/68 | HR 76 | Ht 61.0 in | Wt 120.0 lb

## 2023-09-03 DIAGNOSIS — K219 Gastro-esophageal reflux disease without esophagitis: Secondary | ICD-10-CM

## 2023-09-03 DIAGNOSIS — Z86010 Personal history of colonic polyps: Secondary | ICD-10-CM | POA: Diagnosis not present

## 2023-09-03 DIAGNOSIS — K257 Chronic gastric ulcer without hemorrhage or perforation: Secondary | ICD-10-CM

## 2023-09-03 DIAGNOSIS — K449 Diaphragmatic hernia without obstruction or gangrene: Secondary | ICD-10-CM

## 2023-09-03 DIAGNOSIS — K222 Esophageal obstruction: Secondary | ICD-10-CM

## 2023-09-03 DIAGNOSIS — Z800 Family history of malignant neoplasm of digestive organs: Secondary | ICD-10-CM

## 2023-09-03 MED ORDER — OMEPRAZOLE 40 MG PO CPDR: 40.0000 mg | DELAYED_RELEASE_CAPSULE | Freq: Every day | ORAL | 3 refills | Status: AC

## 2023-09-03 NOTE — Progress Notes (Signed)
HISTORY OF PRESENT ILLNESS:  Sheila [name] is a 67 y.o. female with past medical history as listed below who presents today for follow-up after having undergone recent screening colonoscopy and upper endoscopy with esophageal dilation.  Patient was seen in the office June 04, 2023 regarding chronic GERD and intermittent solid food dysphagia with a history of acute food impaction requiring endoscopic attention.  She also has a family history of colon cancer in her father at age 11.  She was prescribed omeprazole 40 mg daily and set up for endoscopic evaluations.  Complete colonoscopy revealed diminutive adenomas and sigmoid diverticulosis.  Follow-up in 5 years recommended. Upper endoscopy revealed esophagitis with peptic stricture and large hiatal hernia with Sheria Lang erosions.  She was dilated with a balloon dilator to a maximum of 19 mm.  She was continued on PPI with follow-up at this time.  Patient tells me that she has been compliant with medical therapy.  No reflux symptoms.  No further dysphagia.  Tolerating medication well.  We reviewed both endoscopic procedures in detail as well as the pathology.  REVIEW OF SYSTEMS:  All non-GI ROS negative unless otherwise stated in the HPI except for sinus and allergy trouble, anxiety, back pain, visual change, cough, headaches, muscle cramps  Past Medical History:  Diagnosis Date   Allergy    Anxiousness 04/27/2021   Esophageal stricture    GERD (gastroesophageal reflux disease)    Hiatal hernia    HTN (hypertension)    Hyperlipidemia    Hypothyroidism     Past Surgical History:  Procedure Laterality Date   APPENDECTOMY  1967   BACK SURGERY     pinched nerve   ESOPHAGOGASTRODUODENOSCOPY N/A 02/08/2017   Procedure: ESOPHAGOGASTRODUODENOSCOPY (EGD);  Surgeon: Ruffin Frederick, MD;  Location: [facility];  Service: Gastroenterology;  Laterality: N/A;   ESOPHAGOGASTRODUODENOSCOPY (EGD) WITH ESOPHAGEAL DILATION  2004    Social  History Sheila [name]  reports that she has never smoked. She has never used smokeless tobacco. She reports that she does not drink alcohol and does not use drugs.  family history includes Brain cancer in her father; Breast cancer in her mother; Colon cancer in her father; Diabetes in her half-brother and mother; Early death in her father; Emphysema in her maternal grandfather; Heart disease in her maternal grandmother; Hyperlipidemia in her mother; Hypertension in her half-brother.  Allergies  Allergen Reactions   Nsaids        PHYSICAL EXAMINATION: Vital signs: BP 120/68   Pulse 76   Ht 5\' 1"  (1.549 m)   Wt 120 lb (54.4 kg)   LMP 02/13/2005   BMI 22.67 kg/m  General: Well-developed, well-nourished, no acute distress HEENT: Sclerae are anicteric, conjunctiva pink. Oral mucosa intact Lungs: Clear Heart: Regular Abdomen: soft, nontender, nondistended, no obvious ascites, no peritoneal signs, normal bowel sounds. No organomegaly. Extremities: No edema Psychiatric: alert and oriented x3. Cooperative     ASSESSMENT:  1.  GERD complicated by esophagitis and peptic stricture with prior food impaction.  Now asymptomatic post dilation on PPI. 2.  Large hiatal hernia with Sheria Lang erosions.  Last CBC February 2024 was normal with hemoglobin 12.9.  At risk for iron deficiency anemia given Cameron erosions. 3.  Adenomatous colon polyps on recent colonoscopy 4.  Father with colon cancer at age 89   PLAN:  1.  Reflux precautions 2.  Refill omeprazole 40 mg daily.;  #30; 11 refills 3.  Surveillance colonoscopy 5 years 4.  Recommended a daily multivitamin with  iron to reduce the risk of Cameron erosions associated iron deficiency anemia 5.  Routine GI office follow-up 1 year.  Contact the office in the interim for any questions or problems.  She understands. A total time of 30 minutes was spent preparing to see the patient, obtaining interval history, performing medically  appropriate physical examination, counseling and educating the patient regarding the above listed issues, ordering medication, defining follow-up intervals, and documenting clinical information in the health record

## 2023-09-03 NOTE — Patient Instructions (Signed)
We have sent the following medications to your pharmacy for you to pick up at your convenience:  Omeprazole  Take a multivitamin with iron once a day.  Please follow up in one year  _______________________________________________________  If your blood pressure at your visit was 140/90 or greater, please contact your primary care physician to follow up on this.  _______________________________________________________  If you are age 69 or older, your body mass index should be between 23-30. Your Body mass index is 22.67 kg/m. If this is out of the aforementioned range listed, please consider follow up with your Primary Care Provider.  If you are age 87 or younger, your body mass index should be between 19-25. Your Body mass index is 22.67 kg/m. If this is out of the aformentioned range listed, please consider follow up with your Primary Care Provider.   ________________________________________________________  The [facility] providers would like to encourage you to use [facility] to communicate with providers for non-urgent requests or questions.  Due to long hold times on the telephone, sending your provider a message by [facility] may be a faster and more efficient way to get a response.  Please allow 48 business hours for a response.  Please remember that this is for non-urgent requests.  _______________________________________________________

## 2023-12-17 ENCOUNTER — Other Ambulatory Visit: Payer: Self-pay | Admitting: Family Medicine

## 2023-12-24 ENCOUNTER — Ambulatory Visit: Payer: Medicare HMO | Admitting: Family Medicine

## 2023-12-31 ENCOUNTER — Ambulatory Visit (INDEPENDENT_AMBULATORY_CARE_PROVIDER_SITE_OTHER): Payer: Medicare HMO | Admitting: Family Medicine

## 2023-12-31 ENCOUNTER — Encounter: Payer: Self-pay | Admitting: Family Medicine

## 2023-12-31 VITALS — BP 102/62 | HR 102 | Temp 97.9°F | Wt 124.0 lb

## 2023-12-31 DIAGNOSIS — R7309 Other abnormal glucose: Secondary | ICD-10-CM

## 2023-12-31 DIAGNOSIS — G720 Drug-induced myopathy: Secondary | ICD-10-CM | POA: Diagnosis not present

## 2023-12-31 DIAGNOSIS — K219 Gastro-esophageal reflux disease without esophagitis: Secondary | ICD-10-CM | POA: Diagnosis not present

## 2023-12-31 DIAGNOSIS — Z23 Encounter for immunization: Secondary | ICD-10-CM | POA: Diagnosis not present

## 2023-12-31 DIAGNOSIS — M8589 Other specified disorders of bone density and structure, multiple sites: Secondary | ICD-10-CM | POA: Diagnosis not present

## 2023-12-31 DIAGNOSIS — I10 Essential (primary) hypertension: Secondary | ICD-10-CM | POA: Diagnosis not present

## 2023-12-31 DIAGNOSIS — Z79899 Other long term (current) drug therapy: Secondary | ICD-10-CM

## 2023-12-31 DIAGNOSIS — E559 Vitamin D deficiency, unspecified: Secondary | ICD-10-CM | POA: Diagnosis not present

## 2023-12-31 DIAGNOSIS — T466X5A Adverse effect of antihyperlipidemic and antiarteriosclerotic drugs, initial encounter: Secondary | ICD-10-CM

## 2023-12-31 DIAGNOSIS — E782 Mixed hyperlipidemia: Secondary | ICD-10-CM | POA: Diagnosis not present

## 2023-12-31 DIAGNOSIS — Z5181 Encounter for therapeutic drug level monitoring: Secondary | ICD-10-CM

## 2023-12-31 DIAGNOSIS — E034 Atrophy of thyroid (acquired): Secondary | ICD-10-CM | POA: Diagnosis not present

## 2023-12-31 LAB — COMPREHENSIVE METABOLIC PANEL
ALT: 17 U/L (ref 0–35)
AST: 15 U/L (ref 0–37)
Albumin: 4.4 g/dL (ref 3.5–5.2)
Alkaline Phosphatase: 80 U/L (ref 39–117)
BUN: 14 mg/dL (ref 6–23)
CO2: 28 meq/L (ref 19–32)
Calcium: 9.6 mg/dL (ref 8.4–10.5)
Chloride: 100 meq/L (ref 96–112)
Creatinine, Ser: 0.65 mg/dL (ref 0.40–1.20)
GFR: 89.79 mL/min (ref >60.00)
Glucose, Bld: 125 mg/dL — ABNORMAL HIGH (ref 70–99)
Potassium: 5 meq/L (ref 3.5–5.1)
Sodium: 134 meq/L — ABNORMAL LOW (ref 135–145)
Total Bilirubin: 0.4 mg/dL (ref 0.2–1.2)
Total Protein: 6.5 g/dL (ref 6.0–8.3)

## 2023-12-31 LAB — HEMOGLOBIN A1C: Hgb A1c MFr Bld: 6.7 % — ABNORMAL HIGH (ref 4.6–6.5)

## 2023-12-31 LAB — CBC
HCT: 38.7 % (ref 36.0–46.0)
Hemoglobin: 12.8 g/dL (ref 12.0–15.0)
MCHC: 33.1 g/dL (ref 30.0–36.0)
MCV: 95 fL (ref 78.0–100.0)
Platelets: 394 10*3/uL (ref 150.0–400.0)
RBC: 4.07 Mil/uL (ref 3.87–5.11)
RDW: 13 % (ref 11.5–15.5)
WBC: 7.1 10*3/uL (ref 4.0–10.5)

## 2023-12-31 LAB — LIPID PANEL
Cholesterol: 220 mg/dL — ABNORMAL HIGH (ref 0–200)
HDL: 70.3 mg/dL (ref >39.00)
LDL Cholesterol: 134 mg/dL — ABNORMAL HIGH (ref 0–99)
NonHDL: 149.41
Total CHOL/HDL Ratio: 3
Triglycerides: 75 mg/dL (ref 0.0–149.0)
VLDL: 15 mg/dL (ref 0.0–40.0)

## 2023-12-31 LAB — VITAMIN D 25 HYDROXY (VIT D DEFICIENCY, FRACTURES): VITD: 39.89 ng/mL (ref 30.00–100.00)

## 2023-12-31 LAB — MAGNESIUM: Magnesium: 2.1 mg/dL (ref 1.5–2.5)

## 2023-12-31 LAB — TSH: TSH: 2.11 u[IU]/mL (ref 0.35–5.50)

## 2023-12-31 LAB — B12 AND FOLATE PANEL
Folate: 12.3 ng/mL (ref >5.9)
Vitamin B-12: 283 pg/mL (ref 211–911)

## 2023-12-31 MED ORDER — LISINOPRIL 30 MG PO TABS: 30.0000 mg | ORAL_TABLET | Freq: Every day | ORAL | 1 refills | Status: DC

## 2023-12-31 MED ORDER — TETANUS-DIPHTH-ACELL PERTUSSIS 5-2.5-18.5 LF-MCG/0.5 IM SUSP: 0.5000 mL | Freq: Once | INTRAMUSCULAR | 0 refills | Status: AC

## 2023-12-31 NOTE — Progress Notes (Signed)
 Patient ID: Sheila [name], female  DOB: March 18, 1956, 68 y.o.   MRN: [id number] Patient Care Team    Relationship Specialty Notifications Start End  Catherine Charlies LABOR, DO PCP - General Family Medicine  02/08/17   Manda Fess, MD Consulting Physician Obstetrics and Gynecology  02/11/17   Abran Norleen SAILOR, MD Consulting Physician Gastroenterology  02/11/17   Shona Norleen, MD  Dermatology  07/10/22     Chief Complaint  Patient presents with   Hypertension    Chronic Conditions/illness Management Pt is fasting     Subjective: Sheila [name] is a 68 y.o.  Female  present for chronic condition management appointment.  Chart has been updated today with any new PMH, surgical history, family history and medication reconciliation..  Hypertension/HLD/Statin therapy:  Pt reports compliance with lisinopril  30 mg Qd.  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 Patient denies chest pain, shortness of breath, dizziness or lower extremity edema.  Diet: no routine diet and trying to cut as sugar.  Exercise: nothing routine.  RF: HTN, HLD  Hypothyroidism: She reports compliance with levothyroxine  75 mcg daily in the morning before eating or drinking.    Diabetes type 2 with complication: Patient reports not compliant with   Farxiga  5 mg mg daily- never started.  Patient denies dizziness, hyperglycemic or hypoglycemic events. Patient denies numbness, tingling in the extremities or nonhealing wounds of feet.  She had a been in the prediabetic range for a few years, with an A1c of 6.6 in February putting her in the diabetic range.  Last A1c in July of 6.7.  GERD/esophagel stricture-stenosis:  Managed by Dr. Perry-GI Protonix   use prn. She feels she is getting better but will still require med on occassions. She states anything with red sauce or spicy, it creates a great deal of heartburn.        12/31/2023    8:23 AM 02/11/2023    9:07 AM 01/15/2023    2:18 PM 01/23/2022    8:07 AM 05/03/2021     3:54 PM  Depression screen PHQ 2/9  Decreased Interest 1 0 0 0 0  Down, Depressed, Hopeless 0 0 0 0 0  PHQ - 2 Score 1 0 0 0 0      02/11/2023    9:07 AM  GAD 7 : Generalized Anxiety Score  Nervous, Anxious, on Edge 0  Control/stop worrying 0  Worry too much - different things 0  Trouble relaxing 0  Restless 0  Easily annoyed or irritable 0  Afraid - awful might happen 0  Total GAD 7 Score 0     Immunization History  Administered Date(s) Administered   Fluad Quad(high Dose 65+) 10/12/2019   Influenza,inj,Quad PF,6+ Mos 08/13/2018, 08/10/2021   Influenza,inj,quad, With Preservative 09/25/2019   Tdap 12/17/2013   Zoster Recombinant(Shingrix ) 04/08/2017, 06/17/2017    Past Medical History:  Diagnosis Date   Allergy    Anxiousness 04/27/2021   Esophageal stricture    GERD (gastroesophageal reflux disease)    Hiatal hernia    HTN (hypertension)    Hyperlipidemia    Hypothyroidism    Allergies  Allergen Reactions   Nsaids    Past Surgical History:  Procedure Laterality Date   APPENDECTOMY  1967   BACK SURGERY     pinched nerve   ESOPHAGOGASTRODUODENOSCOPY N/A 02/08/2017   Procedure: ESOPHAGOGASTRODUODENOSCOPY (EGD);  Surgeon: Elspeth Deward Naval, MD;  Location: [facility];  Service: Gastroenterology;  Laterality: N/A;   ESOPHAGOGASTRODUODENOSCOPY (EGD) WITH ESOPHAGEAL DILATION  2004   Family History  Problem Relation Age of Onset   Diabetes Mother    Breast cancer Mother    Hyperlipidemia Mother    Colon cancer Father        mets to brain   Early death Father    Brain cancer Father    Heart disease Maternal Grandmother    Emphysema Maternal Grandfather    Diabetes Half-Brother    Hypertension Half-Brother    Esophageal cancer Neg Hx    Rectal cancer Neg Hx    Stomach cancer Neg Hx    Social History   Social History Narrative   Single. 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   Drinks caffeine.   Wears a seatbelt, smoke detector in the home.    Feels safe in her relationships.    Allergies as of 12/31/2023       Reactions   Nsaids         Medication List        Accurate as of December 31, 2023  9:27 AM. If you have any questions, ask your nurse or doctor.          cholecalciferol  25 MCG (1000 UNIT) tablet Commonly known as: VITAMIN D3 Take 4,000 Units by mouth daily.   dapagliflozin  propanediol 5 MG Tabs tablet Commonly known as: Farxiga  Take 1 tablet (5 mg total) by mouth daily.   doxycycline 50 MG capsule Commonly known as: VIBRAMYCIN Take 50 mg by mouth 2 (two) times daily.   famotidine 20 MG tablet Commonly known as: PEPCID Take 20 mg by mouth daily.   fluconazole 150 MG tablet Commonly known as: DIFLUCAN TAKE ONE TABLET BY MOUTH FOR ONE DAY MAY REPEAT IN 3 DAYS IF NEEDED   levothyroxine  75 MCG tablet Commonly known as: SYNTHROID  TAKE 1 TABLET BY MOUTH EVERY MORNING   lisinopril  30 MG tablet Commonly known as: ZESTRIL  Take 1 tablet (30 mg total) by mouth daily.   omeprazole  40 MG capsule Commonly known as: PRILOSEC Take 1 capsule (40 mg total) by mouth daily.   rosuvastatin  10 MG tablet Commonly known as: CRESTOR  Take 10 mg by mouth daily.   Tdap 5-2.5-18.5 LF-MCG/0.5 injection Commonly known as: BOOSTRIX Inject 0.5 mLs into the muscle once for 1 dose. Started by: Charlies Bellini       All past medical history, surgical history, allergies, family history, immunizations andmedications were updated in the EMR today and reviewed under the history and medication portions of their EMR.     No results found for this or any previous visit (from the past 2160 hours).    No results found.  ROS: 14 pt review of systems performed and negative (unless mentioned in an HPI)  Objective: BP 102/62   Pulse (!) 102   Temp 97.9 F (36.6 C)   Wt 124 lb (56.2 kg)   LMP 02/13/2005   SpO2 99%   BMI 23.43 kg/m  Physical Exam Vitals and nursing note reviewed.  Constitutional:      General:  She is not in acute distress.    Appearance: Normal appearance. She is not ill-appearing, toxic-appearing or diaphoretic.  HENT:     Head: Normocephalic and atraumatic.  Eyes:     General: No scleral icterus.       Right eye: No discharge.        Left eye: No discharge.     Extraocular Movements: Extraocular movements intact.     Conjunctiva/sclera: Conjunctivae normal.  Pupils: Pupils are equal, round, and reactive to light.  Cardiovascular:     Rate and Rhythm: Normal rate and regular rhythm.  Pulmonary:     Effort: Pulmonary effort is normal. No respiratory distress.     Breath sounds: Normal breath sounds. No wheezing, rhonchi or rales.  Musculoskeletal:     Right lower leg: No edema.     Left lower leg: No edema.  Skin:    General: Skin is warm.     Findings: No rash.  Neurological:     Mental Status: She is alert and oriented to person, place, and time. Mental status is at baseline.     Motor: No weakness.     Gait: Gait normal.  Psychiatric:        Mood and Affect: Mood normal.        Behavior: Behavior normal.        Thought Content: Thought content normal.        Judgment: Judgment normal.     No results found for this or any previous visit (from the past 48 hours).   Assessment/plan: Sheila [name] is a 68 y.o. female present for management of chronic conditions Essential hypertension/HLD/statin myopathy Stable -  goal is less than 130/80. Continue lisinopril  to 30 mg QD Intolerant to statin-myopathy - low sodium diet. -Routine exercise encouraged.  Hypothyroidism: Continue 75 mcg levothyroxine  Qd.refills will be provided in appropriate dose based on lab result today  Diabetes type 2 with complication - A1c was 6.6 --> 5.4>>6.2> 6.1>6.2> 6.2>6.3 > 5.9 > 5.7>6.6> 6.7>  a1c collected today Metformin  gave her diarrhea.  Never started - farxiga  5 mg every day Microalbumin collected today CMP collected today Diabetic eye exam:[facility]-  GERD:   Managed by Dr. Perry-GI Prescribed Protonix  as needed and Pepcid as needed.  Vitamin D saralee: Supplementing Vit D 4000u.  Vitamin D  levels collected today  Return in about 24 weeks (around 06/16/2024) for Routine chronic condition follow-up.   Orders Placed This Encounter  Procedures   CBC   Comp Met (CMET)   Hemoglobin A1c   Lipid panel   TSH   Vitamin D  (25 hydroxy)   B12 and Folate Panel   Magnesium   Urine Microalbumin w/creat. ratio    Meds ordered this encounter  Medications   lisinopril  (ZESTRIL ) 30 MG tablet    Sig: Take 1 tablet (30 mg total) by mouth daily.    Dispense:  90 tablet    Refill:  1   Tdap (BOOSTRIX) 5-2.5-18.5 LF-MCG/0.5 injection    Sig: Inject 0.5 mLs into the muscle once for 1 dose.    Dispense:  0.5 mL    Refill:  0    Referral Orders  No referral(s) requested today    Electronically signed by: Charlies Bellini, DO [facility]

## 2023-12-31 NOTE — Patient Instructions (Addendum)
 Return in about 24 weeks (around 06/16/2024) for Routine chronic condition follow-up.        Great to see you today.  I have refilled the medication(s) we provide.   If labs were collected or images ordered, we will inform you of  results once we have received them and reviewed. We will contact you either by echart message, or telephone call.  Please give ample time to the testing facility, and our office to run,  receive and review results. Please do not call inquiring of results, even if you can see them in your chart. We will contact you as soon as we are able. If it has been over 1 week since the test was completed, and you have not yet heard from us , then please call us .    - echart message- for normal results that have been seen by the patient already.   - telephone call: abnormal results or if patient has not viewed results in their echart.  If a referral to a specialist was entered for you, please call us  in 2 weeks if you have not heard from the specialist office to schedule.

## 2024-01-01 ENCOUNTER — Telehealth: Payer: Self-pay | Admitting: Family Medicine

## 2024-01-01 LAB — MICROALBUMIN / CREATININE URINE RATIO
Creatinine,U: 68.5 mg/dL
Microalb Creat Ratio: 1 mg/g (ref 0.0–30.0)
Microalb, Ur: <0.7 mg/dL (ref 0.0–1.9)

## 2024-01-01 NOTE — Telephone Encounter (Signed)
 Spoke with patient regarding results/recommendations.

## 2024-01-01 NOTE — Telephone Encounter (Signed)
 Please call patient Liver, thyroid  and kidney functions are normal. A1c has remained the same at 6.7.  Her glucose was also elevated at 125.  This is the diabetic range and I do recommend she start the Farxiga . Blood cell counts, electrolytes, vitamin D  and magnesium levels are normal. Cholesterol panel is at goal. Urine protein ratio is normal.  B12 levels are significantly low at 283.  Goal is at least over 400.  Would recommend she start over-the-counter B12 [phone] mcg daily.  Sublingual tabs or solution that is placed on the tongue is absorbed the best.

## 2024-01-02 DIAGNOSIS — L820 Inflamed seborrheic keratosis: Secondary | ICD-10-CM | POA: Diagnosis not present

## 2024-01-02 DIAGNOSIS — L718 Other rosacea: Secondary | ICD-10-CM | POA: Diagnosis not present

## 2024-01-14 ENCOUNTER — Telehealth: Payer: Self-pay | Admitting: Family Medicine

## 2024-01-14 NOTE — Telephone Encounter (Signed)
Spoke with patient regarding results/recommendations.

## 2024-01-14 NOTE — Telephone Encounter (Signed)
She needs to call her pharmacy

## 2024-01-14 NOTE — Telephone Encounter (Signed)
Patient got a letter from her pharmacy that her medication levothyroxine 75 mg  was recalled she need  know what she needs to do she is almost out of this medication she would like a call back

## 2024-02-03 ENCOUNTER — Telehealth: Payer: Self-pay

## 2024-02-03 ENCOUNTER — Other Ambulatory Visit (HOSPITAL_COMMUNITY): Payer: Self-pay

## 2024-02-03 ENCOUNTER — Ambulatory Visit: Payer: Medicare HMO | Admitting: Family Medicine

## 2024-02-03 ENCOUNTER — Encounter: Payer: Self-pay | Admitting: Family Medicine

## 2024-02-03 ENCOUNTER — Ambulatory Visit: Payer: Self-pay | Admitting: Family Medicine

## 2024-02-03 VITALS — BP 114/70 | HR 78 | Temp 98.4°F | Wt 126.0 lb

## 2024-02-03 DIAGNOSIS — L509 Urticaria, unspecified: Secondary | ICD-10-CM | POA: Diagnosis not present

## 2024-02-03 MED ORDER — METHYLPREDNISOLONE ACETATE 80 MG/ML IJ SUSP
80.0000 mg | Freq: Once | INTRAMUSCULAR | Status: AC
  Administered 2024-02-03: 80 mg via INTRAMUSCULAR

## 2024-02-03 MED ORDER — HYDROXYZINE HCL 10 MG PO TABS
5.0000 mg | ORAL_TABLET | Freq: Three times a day (TID) | ORAL | 0 refills | Status: DC | PRN
Start: 2024-02-03 — End: 2024-04-21

## 2024-02-03 NOTE — Telephone Encounter (Signed)
 Pharmacy Patient Advocate Encounter   Received notification from Physician's Office that prior authorization for hydrOXYzine HCl 10MG  tablets  is required/requested.   Insurance verification completed.   The patient is insured through [employer] [facility] .   Per test claim: PA required; PA submitted to above mentioned insurance via CoverMyMeds Key/confirmation #/EOC  (Key: [id number])   Status is pending

## 2024-02-03 NOTE — Patient Instructions (Signed)
 Hives Hives (urticaria) are itchy, red, swollen areas of skin. They can show up on any part of the body. They often fade within 24 hours (acute hives). If you get new hives after the old ones fade and the cycle goes on for many days or weeks, it is called chronic hives. Hives do not spread from person to person (are not contagious). Hives can happen when your body reacts to something you are allergic to (allergen) or to something that irritates your skin. When you are exposed to something that triggers hives, your body releases a chemical called histamine. This causes redness, itching, and swelling. Hives can show up right after you are exposed to a trigger or hours later. What are the causes? Hives may be caused by: Food allergies. Insect bites or stings. Allergies to pollen or pets. Spending time in sunlight, heat, or cold (exposure). Exercise. Stress. You can also get hives from other conditions and treatments. These include: Viruses, such as the common cold. Bacterial infections, such as urinary tract infections and strep throat. Certain medicines. Contact with latex or chemicals. Allergy shots. Blood transfusions. In some cases, the cause of hives is not known (idiopathic hives). What increases the risk? You are more likely to get hives if: You are female. You have food allergies. Hives are more common if you are allergic to citrus fruits, milk, eggs, peanuts, tree nuts, or shellfish. You are allergic to: Medicines. Latex. Insects. Animals. Pollen. What are the signs or symptoms? Common symptoms of hives include raised, itchy, red or white bumps or patches on your skin. These areas may: Become large and swollen (welts). Quickly change shape and location. This may happen more than once. Be separate hives or connect over a large area of skin. Sting or become painful. Turn white when pressed in the center (blanch). In severe cases, your hands, feet, and face may also become  swollen. This may happen if hives form deeper in your skin. How is this diagnosed? Hives may be diagnosed based on your symptoms, medical history, and a physical exam. You may have skin, pee (urine), or blood tests done. These can help find out what is causing your hives and rule out other health issues. You may also have a biopsy done. This is when a small piece of skin is removed for testing. How is this treated? Treatment for hives depends on the cause and on how severe your symptoms are. You may be told to use cool, wet cloths (cool compresses) or to take cool showers to relieve itching. Treatment may also include: Medicines to help: Relieve itching (antihistamines). Reduce swelling (corticosteroids). Treat infection (antibiotics). An injectable medicine called omalizumab. You may need this if you have chronic idiopathic hives and still have symptoms even after you are treated with antihistamines. In severe cases, you may need to use a device filled with medicine that gives an emergency shot of epinephrine (auto-injector pen) to prevent a very bad allergic reaction (anaphylactic reaction). Follow these instructions at home: Medicines Take and apply over-the-counter and prescription medicines only as told by your health care provider. If you were prescribed antibiotics, take them as told by your provider. Do not stop using the antibiotic even if you start to feel better. Skin care Apply cool compresses to the affected areas. Do not scratch or rub your skin. General instructions Do not take hot showers or baths. This can make itching worse. Do not wear tight-fitting clothing. Use sunscreen. Wear protective clothing when you are outside. Avoid  anything that causes your hives. Keep a journal to help track what causes your hives. Write down: What medicines you take. What you eat and drink. What products you use on your skin. Keep all follow-up visits. Your provider will track how well  treatment is working. Contact a health care provider if: Your symptoms do not get better with medicine. Your joints are painful or swollen. You have a fever. You have pain in your abdomen. Get help right away if: Your tongue, lips, or eyelids swell. Your chest or throat feels tight. You have trouble breathing or swallowing. These symptoms may be an emergency. Use the auto-injector pen right away. Then call 911. Do not wait to see if the symptoms will go away. Do not drive yourself to the hospital. This information is not intended to replace advice given to you by your health care provider. Make sure you discuss any questions you have with your health care provider. Document Revised: 08/30/2022 Document Reviewed: 08/21/2022 Elsevier Patient Education  2024 [employer].

## 2024-02-03 NOTE — Progress Notes (Signed)
 Sheila [name] , 19-Sep-1956, 68 y.o., female MRN: [id number] Patient Care Team    Relationship Specialty Notifications Start End  Natalia Leatherwood, DO PCP - General Family Medicine  02/08/17   Kirkland Hun, MD Consulting Physician Obstetrics and Gynecology  02/11/17   Hilarie Fredrickson, MD Consulting Physician Gastroenterology  02/11/17   Nita Sells, MD  Dermatology  07/10/22     Chief Complaint  Patient presents with   Rash    Pt was given an antibiotic by her GYN for a yeast infection; rash started to form after pt started med. Since Saturday; Face, arms, legs; Mild irritation.      Subjective: Sheila [name] is a 68 y.o. Pt presents for an OV with complaints of outbreak and hives after taking a new prescription for Diflucan.  Patient reports she has taken Diflucan many times in the past, this particular Diflucan tab was a different color and size/shape and was round.  She endorses hive outbreak since Saturday on her face, arms, legs and trunk.  She feels hives in her nose and felt like her nose swelling, that has improved since taking Allegra and Benadryl.  She denies any mouth involvement or breathing difficulties.     12/31/2023    8:23 AM 02/11/2023    9:07 AM 01/15/2023    2:18 PM 01/23/2022    8:07 AM 05/03/2021    3:54 PM  Depression screen PHQ 2/9  Decreased Interest 1 0 0 0 0  Down, Depressed, Hopeless 0 0 0 0 0  PHQ - 2 Score 1 0 0 0 0    Allergies  Allergen Reactions   Nsaids    Social History   Social History Narrative   Single. 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   Drinks caffeine.   Wears a seatbelt, smoke detector in the home.   Feels safe in her relationships.   Past Medical History:  Diagnosis Date   Allergy    Anxiousness 04/27/2021   Esophageal stricture    GERD (gastroesophageal reflux disease)    Hiatal hernia    HTN (hypertension)    Hyperlipidemia    Hypothyroidism    Past Surgical History:  Procedure Laterality Date    APPENDECTOMY  1967   BACK SURGERY     pinched nerve   ESOPHAGOGASTRODUODENOSCOPY N/A 02/08/2017   Procedure: ESOPHAGOGASTRODUODENOSCOPY (EGD);  Surgeon: Ruffin Frederick, MD;  Location: [facility];  Service: Gastroenterology;  Laterality: N/A;   ESOPHAGOGASTRODUODENOSCOPY (EGD) WITH ESOPHAGEAL DILATION  2004   Family History  Problem Relation Age of Onset   Diabetes Mother    Breast cancer Mother    Hyperlipidemia Mother    Colon cancer Father        mets to brain   Early death Father    Brain cancer Father    Heart disease Maternal Grandmother    Emphysema Maternal Grandfather    Diabetes Half-Brother    Hypertension Half-Brother    Esophageal cancer Neg Hx    Rectal cancer Neg Hx    Stomach cancer Neg Hx    Allergies as of 02/03/2024       Reactions   Nsaids         Medication List        Accurate as of February 03, 2024  2:52 PM. If you have any questions, ask your nurse or doctor.          STOP taking these medications  fluconazole 150 MG tablet Commonly known as: DIFLUCAN Stopped by: Felix Pacini       TAKE these medications    cholecalciferol 25 MCG (1000 UNIT) tablet Commonly known as: VITAMIN D3 Take 4,000 Units by mouth daily.   dapagliflozin propanediol 5 MG Tabs tablet Commonly known as: Farxiga Take 1 tablet (5 mg total) by mouth daily.   doxycycline 50 MG capsule Commonly known as: VIBRAMYCIN Take 50 mg by mouth 2 (two) times daily.   famotidine 20 MG tablet Commonly known as: PEPCID Take 20 mg by mouth daily.   hydrOXYzine 10 MG tablet Commonly known as: ATARAX Take 0.5-1 tablets (5-10 mg total) by mouth 3 (three) times daily as needed. Started by: Felix Pacini   levothyroxine 75 MCG tablet Commonly known as: SYNTHROID TAKE 1 TABLET BY MOUTH EVERY MORNING   lisinopril 30 MG tablet Commonly known as: ZESTRIL Take 1 tablet (30 mg total) by mouth daily.   omeprazole 40 MG capsule Commonly known as: PRILOSEC Take 1  capsule (40 mg total) by mouth daily.   rosuvastatin 10 MG tablet Commonly known as: CRESTOR Take 10 mg by mouth daily.        All past medical history, surgical history, allergies, family history, immunizations andmedications were updated in the EMR today and reviewed under the history and medication portions of their EMR.     ROS Negative, with the exception of above mentioned in HPI   Objective:  BP 114/70   Pulse 78   Temp 98.4 F (36.9 C)   Wt 126 lb (57.2 kg)   LMP 02/13/2005   SpO2 99%   BMI 23.81 kg/m  Body mass index is 23.81 kg/m.  Physical Exam Cardiovascular:     Rate and Rhythm: Normal rate and regular rhythm.  Pulmonary:     Effort: Pulmonary effort is normal.  Skin:    Findings: Rash (Maculopapular rash over cheeks, forehead, trunk, lower extremities and upper extremities) present.     No results found. No results found. No results found for this or any previous visit (from the past 24 hours).  Assessment/Plan: Sheila [name] is a 68 y.o. female present for OV for  Urticaria: IM Depo-Medrol injection today Vistaril 5-10 mg every 8 hours as tolerated. Continue to Allegra Discussed Radiographer, therapeutic with pharmacy/gynecology to ensure she does not receive this formulation of Diflucan in the future.  Reviewed expectations re: course of current medical issues. Discussed self-management of symptoms. Outlined signs and symptoms indicating need for more acute intervention. Patient verbalized understanding and all questions were answered. Patient received an After-Visit Summary.    No orders of the defined types were placed in this encounter.  Meds ordered this encounter  Medications   hydrOXYzine (ATARAX) 10 MG tablet    Sig: Take 0.5-1 tablets (5-10 mg total) by mouth 3 (three) times daily as needed.    Dispense:  90 tablet    Refill:  0   methylPREDNISolone acetate (DEPO-MEDROL) injection 80 mg   Referral Orders  No referral(s)  requested today     Note is dictated utilizing voice recognition software. Although note has been proof read prior to signing, occasional typographical errors still can be missed. If any questions arise, please do not hesitate to call for verification.   electronically signed by:  Felix Pacini, DO  [facility] [place]

## 2024-02-03 NOTE — Telephone Encounter (Signed)
 No further action needed.

## 2024-02-03 NOTE — Telephone Encounter (Signed)
 Copied from CRM [id number]. Topic: Clinical - Red Word Triage >> Feb 03, 2024  8:12 AM Gurney Maxin H wrote: [facility] that prompted transfer to Nurse Triage: Took a pill for yeast infection and started itching, started with nose, eyes and spread to face. Legs, body, back is broken and out and itching like crazy, has welts and bumps.  Chief Complaint: Hives Symptoms: hives and itching Frequency: Ongoing since Saturday Pertinent Negatives: Patient denies Difficulty breathing Disposition: [] ED /[] Urgent Care (no appt availability in office) / [x] Appointment(In office/virtual)/ []  [facility]/ [] Home Care/ [] Refused Recommended Disposition /[] [facility]/ []  Follow-up with PCP Additional Notes: Patient stated she has been taking Doxycyline to treat Rosacea. Everytime she takes the antibiotic, it gives her a yeast infection. She stated she was prescribed a yeast infection pill from the OB/GYN. After taking the yeast infection pill, she started having hives, itching, lip swelling. She still has some hives and itching on her face and certain parts of her body. Patient denies any lip swelling or respiratory issues at this time. Appointment scheduled for today.     Reason for Disposition  [1] Widespread hives, itching or facial swelling AND [2] onset > 2 hours after exposure to high-risk allergen (e.g., sting, nuts, 1st dose of antibiotic)  Answer Assessment - Initial Assessment Questions 1. APPEARANCE: "What does the rash look like?"      Red Splotches  2. LOCATION: "Where is the rash located?"     Face, under eyes, legs, arms, back  3. SIZE: "How big are the hives?" (inches, cm, compare to coins) "Do they all look the same or is there lots of variation in shape and size?"      Quarter sized  4. ONSET: "When did the hives begin?" (Hours or days ago)      Saturday  5. ITCHING: "Does it itch?" If Yes, ask: "How bad is the itch?"    - MILD: doesn't interfere with normal  activities   - MODERATE-SEVERE: interferes with work, school, sleep, or other activities      Mild  6. RECURRENT PROBLEM: "Have you had hives before?" If Yes, ask: "When was the last time?" and "What happened that time?"      No  7. TRIGGERS: "Were you exposed to any new food, plant, cosmetic product or animal just before the hives began?"     Patient took a yeast infection pill but she has taken this medication before  8. OTHER SYMPTOMS: "Do you have any other symptoms?" (e.g., fever, tongue swelling, difficulty breathing, abdomen pain)     Lip swelling and nose putty yesterday, none today  Protocols used: Hives-A-AH

## 2024-02-04 ENCOUNTER — Other Ambulatory Visit (HOSPITAL_COMMUNITY): Payer: Self-pay

## 2024-02-04 NOTE — Telephone Encounter (Signed)
 Pharmacy Patient Advocate Encounter  Received notification from [facility] that Prior Authorization for hydrOXYzine HCl 10MG  tablets  has been APPROVED from 12/18/2023 to 12/16/2024. Unable to obtain price due to refill too soon rejection, last fill date 02/03/2024 next available fill date0 02/26/2024   PA #/Case ID/Reference #: [id number]

## 2024-02-26 ENCOUNTER — Other Ambulatory Visit: Payer: Self-pay

## 2024-02-26 MED ORDER — LEVOTHYROXINE SODIUM 75 MCG PO TABS
ORAL_TABLET | ORAL | 2 refills | Status: AC
Start: 2024-02-26 — End: ?

## 2024-02-27 ENCOUNTER — Other Ambulatory Visit: Payer: Self-pay

## 2024-02-27 ENCOUNTER — Other Ambulatory Visit: Payer: Self-pay | Admitting: Family Medicine

## 2024-04-21 ENCOUNTER — Other Ambulatory Visit: Payer: Self-pay

## 2024-04-21 MED ORDER — HYDROXYZINE HCL 10 MG PO TABS
5.0000 mg | ORAL_TABLET | Freq: Three times a day (TID) | ORAL | 0 refills | Status: AC | PRN
Start: 2024-04-21 — End: ?

## 2024-06-16 ENCOUNTER — Ambulatory Visit: Payer: Medicare HMO | Admitting: Family Medicine

## 2024-06-26 ENCOUNTER — Other Ambulatory Visit: Payer: Self-pay | Admitting: Family Medicine

## 2024-07-07 DIAGNOSIS — R7301 Impaired fasting glucose: Secondary | ICD-10-CM | POA: Diagnosis not present

## 2024-07-07 DIAGNOSIS — Z0000 Encounter for general adult medical examination without abnormal findings: Secondary | ICD-10-CM | POA: Diagnosis not present

## 2024-07-07 DIAGNOSIS — E039 Hypothyroidism, unspecified: Secondary | ICD-10-CM | POA: Diagnosis not present

## 2024-07-07 DIAGNOSIS — E782 Mixed hyperlipidemia: Secondary | ICD-10-CM | POA: Diagnosis not present

## 2024-07-07 DIAGNOSIS — E559 Vitamin D deficiency, unspecified: Secondary | ICD-10-CM | POA: Diagnosis not present

## 2024-07-07 DIAGNOSIS — I10 Essential (primary) hypertension: Secondary | ICD-10-CM | POA: Diagnosis not present

## 2024-07-07 DIAGNOSIS — Z6823 Body mass index (BMI) 23.0-23.9, adult: Secondary | ICD-10-CM | POA: Diagnosis not present

## 2024-07-19 ENCOUNTER — Other Ambulatory Visit: Payer: Self-pay | Admitting: Family Medicine

## 2024-08-10 DIAGNOSIS — L509 Urticaria, unspecified: Secondary | ICD-10-CM | POA: Diagnosis not present

## 2024-08-10 DIAGNOSIS — B029 Zoster without complications: Secondary | ICD-10-CM | POA: Diagnosis not present

## 2024-08-24 DIAGNOSIS — L308 Other specified dermatitis: Secondary | ICD-10-CM | POA: Diagnosis not present

## 2024-11-05 DIAGNOSIS — Z1231 Encounter for screening mammogram for malignant neoplasm of breast: Secondary | ICD-10-CM | POA: Diagnosis not present

## 2024-11-20 DIAGNOSIS — L509 Urticaria, unspecified: Secondary | ICD-10-CM | POA: Diagnosis not present

## 2024-12-29 ENCOUNTER — Ambulatory Visit: Admitting: Allergy

## 2025-01-28 ENCOUNTER — Ambulatory Visit: Admitting: Allergy
